# Patient Record
Sex: Female | Born: 1975 | Race: White | Hispanic: No | Marital: Married | Smoking: Never smoker
Health system: Southern US, Community
[De-identification: ages and names within clinical notes are randomized; demographics above are authoritative.]

## PROBLEM LIST (undated history)

## (undated) DIAGNOSIS — B019 Varicella without complication: Secondary | ICD-10-CM

## (undated) DIAGNOSIS — F329 Major depressive disorder, single episode, unspecified: Secondary | ICD-10-CM

## (undated) DIAGNOSIS — F32A Depression, unspecified: Secondary | ICD-10-CM

## (undated) DIAGNOSIS — G43909 Migraine, unspecified, not intractable, without status migrainosus: Secondary | ICD-10-CM

## (undated) HISTORY — DX: Varicella without complication: B01.9

## (undated) HISTORY — DX: Depression, unspecified: F32.A

## (undated) HISTORY — DX: Migraine, unspecified, not intractable, without status migrainosus: G43.909

## (undated) HISTORY — DX: Major depressive disorder, single episode, unspecified: F32.9

---

## 2006-08-28 HISTORY — PX: SALPINGECTOMY: SHX328

## 2007-07-24 ENCOUNTER — Ambulatory Visit (HOSPITAL_COMMUNITY): Admission: RE | Admit: 2007-07-24 | Discharge: 2007-07-24 | Payer: Self-pay | Admitting: Obstetrics and Gynecology

## 2008-09-02 ENCOUNTER — Inpatient Hospital Stay (HOSPITAL_COMMUNITY): Admission: AD | Admit: 2008-09-02 | Discharge: 2008-09-04 | Payer: Self-pay | Admitting: Obstetrics and Gynecology

## 2010-08-31 ENCOUNTER — Inpatient Hospital Stay (HOSPITAL_COMMUNITY)
Admission: AD | Admit: 2010-08-31 | Discharge: 2010-09-02 | Payer: Self-pay | Source: Home / Self Care | Attending: Obstetrics and Gynecology | Admitting: Obstetrics and Gynecology

## 2010-08-31 LAB — CBC
HCT: 37.8 % (ref 36.0–46.0)
Hemoglobin: 13 g/dL (ref 12.0–15.0)
MCH: 30.2 pg (ref 26.0–34.0)
MCHC: 34.4 g/dL (ref 30.0–36.0)
MCV: 87.9 fL (ref 78.0–100.0)
Platelets: 236 10*3/uL (ref 150–400)
RBC: 4.3 MIL/uL (ref 3.87–5.11)
RDW: 13.8 % (ref 11.5–15.5)
WBC: 9.9 10*3/uL (ref 4.0–10.5)

## 2010-08-31 LAB — RPR: RPR Ser Ql: NONREACTIVE

## 2010-09-01 LAB — CBC
HCT: 35 % — ABNORMAL LOW (ref 36.0–46.0)
Hemoglobin: 11.5 g/dL — ABNORMAL LOW (ref 12.0–15.0)
MCH: 29.6 pg (ref 26.0–34.0)
MCHC: 32.9 g/dL (ref 30.0–36.0)
MCV: 90.2 fL (ref 78.0–100.0)
Platelets: 214 10*3/uL (ref 150–400)
RBC: 3.88 MIL/uL (ref 3.87–5.11)
RDW: 14.1 % (ref 11.5–15.5)
WBC: 11.8 10*3/uL — ABNORMAL HIGH (ref 4.0–10.5)

## 2010-12-12 LAB — RPR: RPR Ser Ql: NONREACTIVE

## 2010-12-12 LAB — CBC
HCT: 35.9 % — ABNORMAL LOW (ref 36.0–46.0)
HCT: 41.6 % (ref 36.0–46.0)
Hemoglobin: 12.3 g/dL (ref 12.0–15.0)
Hemoglobin: 14.3 g/dL (ref 12.0–15.0)
MCHC: 34.2 g/dL (ref 30.0–36.0)
MCHC: 34.3 g/dL (ref 30.0–36.0)
MCV: 96.4 fL (ref 78.0–100.0)
MCV: 97.1 fL (ref 78.0–100.0)
Platelets: 197 10*3/uL (ref 150–400)
Platelets: 226 10*3/uL (ref 150–400)
RBC: 3.7 MIL/uL — ABNORMAL LOW (ref 3.87–5.11)
RBC: 4.32 MIL/uL (ref 3.87–5.11)
RDW: 13 % (ref 11.5–15.5)
RDW: 13.1 % (ref 11.5–15.5)
WBC: 11.1 10*3/uL — ABNORMAL HIGH (ref 4.0–10.5)
WBC: 11.5 10*3/uL — ABNORMAL HIGH (ref 4.0–10.5)

## 2013-06-11 ENCOUNTER — Ambulatory Visit (INDEPENDENT_AMBULATORY_CARE_PROVIDER_SITE_OTHER): Payer: BC Managed Care – PPO | Admitting: Emergency Medicine

## 2013-06-11 VITALS — BP 110/70 | HR 66 | Temp 98.0°F | Resp 16 | Ht 66.0 in | Wt 176.0 lb

## 2013-06-11 DIAGNOSIS — J018 Other acute sinusitis: Secondary | ICD-10-CM

## 2013-06-11 DIAGNOSIS — J209 Acute bronchitis, unspecified: Secondary | ICD-10-CM

## 2013-06-11 MED ORDER — AMOXICILLIN-POT CLAVULANATE 875-125 MG PO TABS: 1.0000 | ORAL_TABLET | Freq: Two times a day (BID) | ORAL | Status: DC

## 2013-06-11 MED ORDER — PSEUDOEPHEDRINE-GUAIFENESIN ER 60-600 MG PO TB12: 1.0000 | ORAL_TABLET | Freq: Two times a day (BID) | ORAL | Status: AC

## 2013-06-11 MED ORDER — HYDROCOD POLST-CHLORPHEN POLST 10-8 MG/5ML PO LQCR: 5.0000 mL | Freq: Two times a day (BID) | ORAL | Status: DC | PRN

## 2013-06-11 NOTE — Progress Notes (Signed)
[facility] [address], [place] [address] [phone]  Date:  06/11/2013   Name:  Victoria Tanner   DOB:  Apr 23, 1976   MRN:  [id number]  PCP:  Almon Hercules., MD    Chief Complaint: Cough   History of Present Illness:  Victoria Tanner is a 37 y.o. very pleasant female patient who presents with the following:  Ill with nasal drainage and post nasal drip.  Has a cough productive purulent sputum.  Worse at night.  No wheezing or shortness of breath.  No fever or chills.  Sore throat.  Eating and drinking, no nausea or vomiting.  No stool change or rash.  No improvement with over the counter medications or other home remedies. Denies other complaint or health concern today.   There are no active problems to display for this patient.   History reviewed. No pertinent past medical history.  History reviewed. No pertinent past surgical history.  History  Substance Use Topics  . Smoking status: Never Smoker   . Smokeless tobacco: Not on file  . Alcohol Use: Not on file    History reviewed. No pertinent family history.  No Known Allergies  Medication list has been reviewed and updated.  No current outpatient prescriptions on file prior to visit.   No current facility-administered medications on file prior to visit.    Review of Systems:  As per HPI, otherwise negative.    Physical Examination: Filed Vitals:   06/11/13 1103  BP: 110/70  Pulse: 66  Temp: 98 F (36.7 C)  Resp: 16   Filed Vitals:   06/11/13 1103  Height: 5\' 6"  (1.676 m)  Weight: 176 lb (79.833 kg)   Body mass index is 28.42 kg/(m^2). Ideal Body Weight: Weight in (lb) to have BMI = 25: 154.6  GEN: WDWN, NAD, Non-toxic, A & O x 3 HEENT: Atraumatic, Normocephalic. Neck supple. No masses, No LAD. Ears and Nose: No external deformity. CV: RRR, No M/G/R. No JVD. No thrill. No extra heart sounds. PULM: CTA B, no wheezes, crackles, rhonchi. No retractions. No resp. distress. No  accessory muscle use. ABD: S, NT, ND, +BS. No rebound. No HSM. EXTR: No c/c/e NEURO Normal gait.  PSYCH: Normally interactive. Conversant. Not depressed or anxious appearing.  Calm demeanor.    Assessment and Plan: Sinusitis Bronchitis mucinex c tussionex augmentin   Signed,  Phillips Odor, MD

## 2013-06-11 NOTE — Patient Instructions (Signed)

## 2014-10-06 ENCOUNTER — Ambulatory Visit (INDEPENDENT_AMBULATORY_CARE_PROVIDER_SITE_OTHER): Payer: BLUE CROSS/BLUE SHIELD

## 2014-10-06 ENCOUNTER — Ambulatory Visit (INDEPENDENT_AMBULATORY_CARE_PROVIDER_SITE_OTHER): Payer: BLUE CROSS/BLUE SHIELD | Admitting: Emergency Medicine

## 2014-10-06 VITALS — BP 110/68 | HR 80 | Temp 98.2°F | Resp 16 | Ht 64.0 in | Wt 185.0 lb

## 2014-10-06 DIAGNOSIS — J0140 Acute pansinusitis, unspecified: Secondary | ICD-10-CM

## 2014-10-06 DIAGNOSIS — R059 Cough, unspecified: Secondary | ICD-10-CM

## 2014-10-06 DIAGNOSIS — R05 Cough: Secondary | ICD-10-CM

## 2014-10-06 DIAGNOSIS — J209 Acute bronchitis, unspecified: Secondary | ICD-10-CM

## 2014-10-06 MED ORDER — HYDROCOD POLST-CHLORPHEN POLST 10-8 MG/5ML PO LQCR: 5.0000 mL | Freq: Two times a day (BID) | ORAL | Status: DC | PRN

## 2014-10-06 MED ORDER — PSEUDOEPHEDRINE-GUAIFENESIN ER 60-600 MG PO TB12: 1.0000 | ORAL_TABLET | Freq: Two times a day (BID) | ORAL | Status: DC

## 2014-10-06 MED ORDER — AMOXICILLIN-POT CLAVULANATE 875-125 MG PO TABS: 1.0000 | ORAL_TABLET | Freq: Two times a day (BID) | ORAL | Status: DC

## 2014-10-06 NOTE — Patient Instructions (Signed)

## 2014-10-06 NOTE — Progress Notes (Signed)
[facility] [address], [place] [id number] [phone]  Date:  10/06/2014   Name:  Victoria HuskyMelisa S Tanner   DOB:  10-06-1975   MRN:  [id number]  PCP:  Almon HerculesOSS,KENDRA H., MD    Chief Complaint: Cough   History of Present Illness:  Victoria Tanner is a 39 y.o. very pleasant female patient who presents with the following:  2 week history of cough productive of green sputum and post tussive wheezing. Some purulent post nasal drainage. No sore throat No fever or chills No nausea or vomiting.  No stool change No improvement with over the counter medications or other home remedies.  Denies other complaint or health concern today.   There are no active problems to display for this patient.   No past medical history on file.  No past surgical history on file.  History  Substance Use Topics  . Smoking status: Never Smoker   . Smokeless tobacco: Not on file  . Alcohol Use: Not on file    No family history on file.  No Known Allergies  Medication list has been reviewed and updated.  No current outpatient prescriptions on file prior to visit.   No current facility-administered medications on file prior to visit.    Review of Systems:  As per HPI, otherwise negative.    Physical Examination: Filed Vitals:   10/06/14 1051  BP: 110/68  Pulse: 80  Temp: 98.2 F (36.8 C)  Resp: 16   Filed Vitals:   10/06/14 1051  Height: 5\' 4"  (1.626 m)  Weight: 185 lb (83.915 kg)   Body mass index is 31.74 kg/(m^2). Ideal Body Weight: Weight in (lb) to have BMI = 25: 145.3  GEN: WDWN, NAD, Non-toxic, A & O x 3 HEENT: Atraumatic, Normocephalic. Neck supple. No masses, No LAD. Ears and Nose: No external deformity. CV: RRR, No M/G/R. No JVD. No thrill. No extra heart sounds. PULM: CTA B, scattered wheezes, no crackles, rhonchi. No retractions. No resp. distress. No accessory muscle use. ABD: S, NT, ND, +BS. No rebound. No HSM. EXTR: No c/c/e NEURO Normal gait.   PSYCH: Normally interactive. Conversant. Not depressed or anxious appearing.  Calm demeanor.    Assessment and Plan: Sinusitis Bronchitis augmentin mucinex d tussionex   Signed,  Phillips OdorJeffery Anderson, MD   [facility] reading (PRIMARY) by  Dr. Dareen PianoAnderson.  Negative chest.

## 2015-03-17 ENCOUNTER — Ambulatory Visit (INDEPENDENT_AMBULATORY_CARE_PROVIDER_SITE_OTHER): Payer: BLUE CROSS/BLUE SHIELD | Admitting: Emergency Medicine

## 2015-03-17 DIAGNOSIS — T63441A Toxic effect of venom of bees, accidental (unintentional), initial encounter: Secondary | ICD-10-CM

## 2015-03-17 MED ORDER — METHYLPREDNISOLONE ACETATE 80 MG/ML IJ SUSP
120.0000 mg | Freq: Once | INTRAMUSCULAR | Status: AC
  Administered 2015-03-17: 120 mg via INTRAMUSCULAR

## 2015-03-17 NOTE — Progress Notes (Signed)
Subjective:  Patient ID: Victoria Tanner, female    DOB: 1976-08-24  Age: 39 y.o. MRN: [id number]  CC: Insect Bite   HPI Victoria Tanner presents  with numerous insect bites. She was assembling a swimming pool in the backyard and he inadvertently stepped on a nest of some stinging insects that she thinks was Journalist, newspaperyellowjacket. She was stung repeatedly and lower extremities. This occurred yesterday. She has no difficulty swallowing or breathing. She has no trouble speaking. She has no systemic rash.  History Victoria Tanner has no past medical history on file.   She has no past surgical history on file.   Her  family history is not on file.  She   reports that she has never smoked. She does not have any smokeless tobacco history on file. Her alcohol and drug histories are not on file.  Outpatient Prescriptions Prior to Visit  Medication Sig Dispense Refill  . amoxicillin-clavulanate (AUGMENTIN) 875-125 MG per tablet Take 1 tablet by mouth 2 (two) times daily. (Patient not taking: Reported on 03/17/2015) 20 tablet 0  . chlorpheniramine-HYDROcodone (TUSSIONEX PENNKINETIC ER) 10-8 MG/5ML LQCR Take 5 mLs by mouth every 12 (twelve) hours as needed. (Patient not taking: Reported on 03/17/2015) 60 mL 0  . pseudoephedrine-guaifenesin (MUCINEX D) 60-600 MG per tablet Take 1 tablet by mouth every 12 (twelve) hours. (Patient not taking: Reported on 03/17/2015) 18 tablet 0   No facility-administered medications prior to visit.    History   Social History  . Marital Status: Married    Spouse Name: N/A  . Number of Children: N/A  . Years of Education: N/A   Social History Main Topics  . Smoking status: Never Smoker   . Smokeless tobacco: Not on file  . Alcohol Use: Not on file  . Drug Use: Not on file  . Sexual Activity: Not on file   Other Topics Concern  . Not on file   Social History Narrative     Review of Systems  Constitutional: Negative for fever, chills and appetite change.  HENT:  Negative for congestion, ear pain, postnasal drip, sinus pressure and sore throat.   Eyes: Negative for pain and redness.  Respiratory: Negative for cough, shortness of breath and wheezing.   Cardiovascular: Negative for leg swelling.  Gastrointestinal: Negative for nausea, vomiting, abdominal pain, diarrhea, constipation and blood in stool.  Endocrine: Negative for polyuria.  Genitourinary: Negative for dysuria, urgency, frequency and flank pain.  Musculoskeletal: Negative for gait problem.  Skin: Negative for rash.  Neurological: Negative for weakness and headaches.  Psychiatric/Behavioral: Negative for confusion and decreased concentration. The patient is not nervous/anxious.     Objective:  There were no vitals taken for this visit.  Physical Exam  Constitutional: She is oriented to person, place, and time. She appears well-developed and well-nourished.  HENT:  Head: Normocephalic and atraumatic.  Eyes: Conjunctivae are normal. Pupils are equal, round, and reactive to light.  Pulmonary/Chest: Effort normal.  Musculoskeletal: She exhibits no edema.  Neurological: She is alert and oriented to person, place, and time.  Skin: Skin is dry. There is erythema.  Psychiatric: She has a normal mood and affect. Her behavior is normal. Thought content normal.      Assessment & Plan:   Victoria Tanner was seen today for insect bite.  Diagnoses and all orders for this visit:  Bee sting, accidental or unintentional, initial encounter Orders: -     methylPREDNISolone acetate (DEPO-MEDROL) injection 120 mg; Inject 1.5 mLs (120 mg total)  into the muscle once.   I am having Victoria Tanner maintain her amoxicillin-clavulanate, pseudoephedrine-guaifenesin, chlorpheniramine-HYDROcodone, and diphenhydrAMINE. We administered methylPREDNISolone acetate.  Meds ordered this encounter  Medications  . diphenhydrAMINE (BENADRYL) 25 MG tablet    Sig: Take 25 mg by mouth every 6 (six) hours as needed.  .  methylPREDNISolone acetate (DEPO-MEDROL) injection 120 mg    Sig:     Appropriate red flag conditions were discussed with the patient as well as actions that should be taken.  Patient expressed his understanding.  Follow-up: Return if symptoms worsen or fail to improve.  Carmelina Dane, MD

## 2015-03-17 NOTE — Patient Instructions (Signed)

## 2015-06-21 ENCOUNTER — Encounter: Payer: Self-pay | Admitting: Adult Health

## 2015-06-21 ENCOUNTER — Ambulatory Visit (INDEPENDENT_AMBULATORY_CARE_PROVIDER_SITE_OTHER): Payer: BLUE CROSS/BLUE SHIELD | Admitting: Adult Health

## 2015-06-21 VITALS — BP 110/70 | Temp 98.2°F | Ht 65.0 in | Wt 190.1 lb

## 2015-06-21 DIAGNOSIS — Z0000 Encounter for general adult medical examination without abnormal findings: Secondary | ICD-10-CM

## 2015-06-21 NOTE — Patient Instructions (Addendum)
It was great meeting you today!   Make an appointment at the front for your lab draw.   You can follow up with me in one year for your next physical exam or sooner if needed.   Continue to eat healthy and exercise.   If you need anything or have any questions, please let me know.   Health Maintenance, Female Adopting a healthy lifestyle and getting preventive care can go a long way to promote health and wellness. Talk with your health care provider about what schedule of regular examinations is right for you. This is a good chance for you to check in with your provider about disease prevention and staying healthy. In between checkups, there are plenty of things you can do on your own. Experts have done a lot of research about which lifestyle changes and preventive measures are most likely to keep you healthy. Ask your health care provider for more information. WEIGHT AND DIET  Eat a healthy diet  Be sure to include plenty of vegetables, fruits, low-fat dairy products, and lean protein.  Do not eat a lot of foods high in solid fats, added sugars, or salt.  Get regular exercise. This is one of the most important things you can do for your health.  Most adults should exercise for at least 150 minutes each week. The exercise should increase your heart rate and make you sweat (moderate-intensity exercise).  Most adults should also do strengthening exercises at least twice a week. This is in addition to the moderate-intensity exercise.  Maintain a healthy weight  Body mass index (BMI) is a measurement that can be used to identify possible weight problems. It estimates body fat based on height and weight. Your health care provider can help determine your BMI and help you achieve or maintain a healthy weight.  For females 24 years of age and older:   A BMI below 18.5 is considered underweight.  A BMI of 18.5 to 24.9 is normal.  A BMI of 25 to 29.9 is considered overweight.  A BMI of 30  and above is considered obese.  Watch levels of cholesterol and blood lipids  You should start having your blood tested for lipids and cholesterol at 40 years of age, then have this test every 5 years.  You may need to have your cholesterol levels checked more often if:  Your lipid or cholesterol levels are high.  You are older than 40 years of age.  You are at high risk for heart disease.  CANCER SCREENING   Lung Cancer  Lung cancer screening is recommended for adults 40-24 years old who are at high risk for lung cancer because of a history of smoking.  A yearly low-dose CT scan of the lungs is recommended for people who:  Currently smoke.  Have quit within the past 15 years.  Have at least a 30-pack-year history of smoking. A pack year is smoking an average of one pack of cigarettes a day for 1 year.  Yearly screening should continue until it has been 15 years since you quit.  Yearly screening should stop if you develop a health problem that would prevent you from having lung cancer treatment.  Breast Cancer  Practice breast self-awareness. This means understanding how your breasts normally appear and feel.  It also means doing regular breast self-exams. Let your health care provider know about any changes, no matter how small.  If you are in your 20s or 30s, you should have a  clinical breast exam (CBE) by a health care provider every 1-3 years as part of a regular health exam.  If you are 40 or older, have a CBE every year. Also consider having a breast X-ray (mammogram) every year.  If you have a family history of breast cancer, talk to your health care provider about genetic screening.  If you are at high risk for breast cancer, talk to your health care provider about having an MRI and a mammogram every year.  Breast cancer gene (BRCA) assessment is recommended for women who have family members with BRCA-related cancers. BRCA-related cancers  include:  Breast.  Ovarian.  Tubal.  Peritoneal cancers.  Results of the assessment will determine the need for genetic counseling and BRCA1 and BRCA2 testing. Cervical Cancer Your health care provider may recommend that you be screened regularly for cancer of the pelvic organs (ovaries, uterus, and vagina). This screening involves a pelvic examination, including checking for microscopic changes to the surface of your cervix (Pap test). You may be encouraged to have this screening done every 3 years, beginning at age 21.  For women ages 30-65, health care providers may recommend pelvic exams and Pap testing every 3 years, or they may recommend the Pap and pelvic exam, combined with testing for human papilloma virus (HPV), every 5 years. Some types of HPV increase your risk of cervical cancer. Testing for HPV may also be done on women of any age with unclear Pap test results.  Other health care providers may not recommend any screening for nonpregnant women who are considered low risk for pelvic cancer and who do not have symptoms. Ask your health care provider if a screening pelvic exam is right for you.  If you have had past treatment for cervical cancer or a condition that could lead to cancer, you need Pap tests and screening for cancer for at least 20 years after your treatment. If Pap tests have been discontinued, your risk factors (such as having a new sexual partner) need to be reassessed to determine if screening should resume. Some women have medical problems that increase the chance of getting cervical cancer. In these cases, your health care provider may recommend more frequent screening and Pap tests. Colorectal Cancer  This type of cancer can be detected and often prevented.  Routine colorectal cancer screening usually begins at 40 years of age and continues through 40 years of age.  Your health care provider may recommend screening at an earlier age if you have risk factors for  colon cancer.  Your health care provider may also recommend using home test kits to check for hidden blood in the stool.  A small camera at the end of a tube can be used to examine your colon directly (sigmoidoscopy or colonoscopy). This is done to check for the earliest forms of colorectal cancer.  Routine screening usually begins at age 50.  Direct examination of the colon should be repeated every 5-10 years through 40 years of age. However, you may need to be screened more often if early forms of precancerous polyps or small growths are found. Skin Cancer  Check your skin from head to toe regularly.  Tell your health care provider about any new moles or changes in moles, especially if there is a change in a mole's shape or color.  Also tell your health care provider if you have a mole that is larger than the size of a pencil eraser.  Always use sunscreen. Apply sunscreen liberally   and repeatedly throughout the day.  Protect yourself by wearing long sleeves, pants, a wide-brimmed hat, and sunglasses whenever you are outside. HEART DISEASE, DIABETES, AND HIGH BLOOD PRESSURE   High blood pressure causes heart disease and increases the risk of stroke. High blood pressure is more likely to develop in:  People who have blood pressure in the high end of the normal range (130-139/85-89 mm Hg).  People who are overweight or obese.  People who are African American.  If you are 68-18 years of age, have your blood pressure checked every 3-5 years. If you are 96 years of age or older, have your blood pressure checked every year. You should have your blood pressure measured twice--once when you are at a hospital or clinic, and once when you are not at a hospital or clinic. Record the average of the two measurements. To check your blood pressure when you are not at a hospital or clinic, you can use:  An automated blood pressure machine at a pharmacy.  A home blood pressure monitor.  If you  are between 52 years and 37 years old, ask your health care provider if you should take aspirin to prevent strokes.  Have regular diabetes screenings. This involves taking a blood sample to check your fasting blood sugar level.  If you are at a normal weight and have a low risk for diabetes, have this test once every three years after 40 years of age.  If you are overweight and have a high risk for diabetes, consider being tested at a younger age or more often. PREVENTING INFECTION  Hepatitis B  If you have a higher risk for hepatitis B, you should be screened for this virus. You are considered at high risk for hepatitis B if:  You were born in a country where hepatitis B is common. Ask your health care provider which countries are considered high risk.  Your parents were born in a high-risk country, and you have not been immunized against hepatitis B (hepatitis B vaccine).  You have HIV or AIDS.  You use needles to inject street drugs.  You live with someone who has hepatitis B.  You have had sex with someone who has hepatitis B.  You get hemodialysis treatment.  You take certain medicines for conditions, including cancer, organ transplantation, and autoimmune conditions. Hepatitis C  Blood testing is recommended for:  Everyone born from [birth year] through 1966.  Anyone with known risk factors for hepatitis C. Sexually transmitted infections (STIs)  You should be screened for sexually transmitted infections (STIs) including gonorrhea and chlamydia if:  You are sexually active and are younger than 40 years of age.  You are older than 40 years of age and your health care provider tells you that you are at risk for this type of infection.  Your sexual activity has changed since you were last screened and you are at an increased risk for chlamydia or gonorrhea. Ask your health care provider if you are at risk.  If you do not have HIV, but are at risk, it may be recommended that you  take a prescription medicine daily to prevent HIV infection. This is called pre-exposure prophylaxis (PrEP). You are considered at risk if:  You are sexually active and do not regularly use condoms or know the HIV status of your partner(s).  You take drugs by injection.  You are sexually active with a partner who has HIV. Talk with your health care provider about whether you are  at high risk of being infected with HIV. If you choose to begin PrEP, you should first be tested for HIV. You should then be tested every 3 months for as long as you are taking PrEP.  PREGNANCY   If you are premenopausal and you may become pregnant, ask your health care provider about preconception counseling.  If you may become pregnant, take 400 to 800 micrograms (mcg) of folic acid every day.  If you want to prevent pregnancy, talk to your health care provider about birth control (contraception). OSTEOPOROSIS AND MENOPAUSE   Osteoporosis is a disease in which the bones lose minerals and strength with aging. This can result in serious bone fractures. Your risk for osteoporosis can be identified using a bone density scan.  If you are 56 years of age or older, or if you are at risk for osteoporosis and fractures, ask your health care provider if you should be screened.  Ask your health care provider whether you should take a calcium or vitamin D supplement to lower your risk for osteoporosis.  Menopause may have certain physical symptoms and risks.  Hormone replacement therapy may reduce some of these symptoms and risks. Talk to your health care provider about whether hormone replacement therapy is right for you.  HOME CARE INSTRUCTIONS   Schedule regular health, dental, and eye exams.  Stay current with your immunizations.   Do not use any tobacco products including cigarettes, chewing tobacco, or electronic cigarettes.  If you are pregnant, do not drink alcohol.  If you are breastfeeding, limit how  much and how often you drink alcohol.  Limit alcohol intake to no more than 1 drink per day for nonpregnant women. One drink equals 12 ounces of beer, 5 ounces of wine, or 1 ounces of hard liquor.  Do not use street drugs.  Do not share needles.  Ask your health care provider for help if you need support or information about quitting drugs.  Tell your health care provider if you often feel depressed.  Tell your health care provider if you have ever been abused or do not feel safe at home.   This information is not intended to replace advice given to you by your health care provider. Make sure you discuss any questions you have with your health care provider.   Document Released: 02/27/2011 Document Revised: 09/04/2014 Document Reviewed: 07/16/2013 Elsevier Interactive Patient Education [employer].

## 2015-06-21 NOTE — Progress Notes (Signed)
Pre visit review using our clinic review tool, if applicable. No additional management support is needed unless otherwise documented below in the visit note. 

## 2015-06-21 NOTE — Progress Notes (Signed)
HPI:  Victoria Tanner is here to establish care and for her complete physical.  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 Does not eat a lot of processed foods.  Exercise: Does high intensity interval training 3-4 times a week. Walks the dog  Pap Smear:August 2016 Dentist: Twice a year Eye exam: Yearly  Is followed by: GYN - [facility] Tenny Crawoss [facility]   All immunizations and health maintenance protocols were reviewed with the patient and needed orders were placed.  Appropriate screening laboratory values were ordered for the patient including screening of hyperlipidemia, renal function and hepatic function.  Medication reconciliation,  past medical history, social history, problem list and allergies were reviewed in detail with the patient  Goals were established with regard to weight loss, exercise, and  diet in compliance with medications   Has the following chronic problems that require follow up and concerns today:   Depression: Is well controlled. She was on antidepressants last year but has since taken herself off. Has no complaints of depression.    ROS negative for unless reported above: fevers, chills,feeling poorly, unintentional weight loss, hearing or vision loss, chest pain, palpitations, leg claudication, struggling to breath,Not feeling congested in the chest, no orthopenia, no cough,no wheezing, normal appetite, no soft tissue swelling, no hemoptysis, melena, hematochezia, hematuria, falls, loc, si, or thoughts of self harm.    Past Medical History  Diagnosis Date  . Depression   . Migraines   . Chicken pox     History reviewed. No pertinent past surgical history.  Family History  Problem Relation Age of Onset  . Arthritis Maternal Grandmother   . Elevated Lipids Paternal Grandfather   . Elevated Lipids Maternal Grandmother   . Elevated Lipids Father   .  Hypertension Mother   . Hypertension Maternal Grandmother   . Stroke Maternal Grandmother   . Stroke Paternal Grandmother   . Depression Mother   . OCD Mother   . ADD / ADHD Mother   . Depression Maternal Grandmother   . OCD Maternal Grandmother   . ADD / ADHD Maternal Grandmother   . Diabetes Paternal Grandfather     Social History   Social History  . Marital Status: Married    Spouse Name: N/A  . Number of Children: N/A  . Years of Education: N/A   Social History Main Topics  . Smoking status: Never Smoker   . Smokeless tobacco: None  . Alcohol Use: No  . Drug Use: No  . Sexual Activity: Not Asked   Other Topics Concern  . None   Social History Narrative    No current outpatient prescriptions on file.  EXAM:  Filed Vitals:   06/21/15 1006  BP: 110/70  Temp: 98.2 F (36.8 C)    Body mass index is 31.63 kg/(m^2).  GENERAL: vitals reviewed and listed above, alert, oriented, appears well hydrated and in no acute distress. Slightly obese  HEENT: atraumatic, conjunttiva clear, no obvious abnormalities on inspection of external nose and ears. TM's visualized. No cerumen impaction.  NECK: Neck is soft and supple without masses, no adenopathy or thyromegaly, trachea midline, no JVD. Normal range of motion.   LUNGS: clear to auscultation bilaterally, no wheezes, rales or rhonchi, good air movement  CV: Regular rate and rhythm, normal S1/S2, no audible murmurs, gallops, or rubs. No carotid bruit and no peripheral edema.   MS:  moves all extremities without noticeable abnormality. No edema noted  Abd: soft/nontender/nondistended/normal bowel sounds   Skin: warm and dry, no rash   Extremities: No clubbing, cyanosis, or edema. Capillary refill is WNL. Pulses intact bilaterally in upper and lower extremities.   Neuro: CN II-XII intact, sensation and reflexes normal throughout, 5/5 muscle strength in bilateral upper and lower extremities. Normal finger to nose.  Normal rapid alternating movements.   PSYCH: pleasant and cooperative, no obvious depression or anxiety  ASSESSMENT AND PLAN:  1. Routine general medical examination at a health care facility - Basic metabolic panel; Future - CBC with Differential/Platelet; Future - Hemoglobin A1c; Future - Hepatic function panel; Future - Lipid panel; Future - TSH; Future - POCT urinalysis dipstick; Future - Continue to eat healthy and exercise.  - Follow up in one year for CPE - Follow up sooner if needed.   -We reviewed the PMH, PSH, FH, SH, Meds and Allergies. -We provided refills for any medications we will prescribe as needed. -We addressed current concerns per orders and patient instructions. -We have asked for records for pertinent exams, studies, vaccines and notes from previous providers. -We have advised patient to follow up per instructions below.   -Patient advised to return or notify a provider immediately if symptoms worsen or persist or new concerns arise.    Shirline Frees, AGNP

## 2015-06-30 ENCOUNTER — Other Ambulatory Visit (INDEPENDENT_AMBULATORY_CARE_PROVIDER_SITE_OTHER): Payer: BLUE CROSS/BLUE SHIELD

## 2015-06-30 DIAGNOSIS — Z0000 Encounter for general adult medical examination without abnormal findings: Secondary | ICD-10-CM | POA: Diagnosis not present

## 2015-06-30 LAB — POCT URINALYSIS DIPSTICK
Bilirubin, UA: NEGATIVE
Glucose, UA: NEGATIVE
Ketones, UA: NEGATIVE
Leukocytes, UA: NEGATIVE
Nitrite, UA: NEGATIVE
Protein, UA: NEGATIVE
Spec Grav, UA: 1.01
Urobilinogen, UA: 0.2
pH, UA: 6

## 2015-06-30 LAB — CBC WITH DIFFERENTIAL/PLATELET
Basophils Absolute: 0.1 10*3/uL (ref 0.0–0.1)
Basophils Relative: 0.9 % (ref 0.0–3.0)
Eosinophils Absolute: 0.2 10*3/uL (ref 0.0–0.7)
Eosinophils Relative: 2 % (ref 0.0–5.0)
HCT: 41.6 % (ref 36.0–46.0)
Hemoglobin: 13.6 g/dL (ref 12.0–15.0)
Lymphocytes Relative: 23.7 % (ref 12.0–46.0)
Lymphs Abs: 1.8 10*3/uL (ref 0.7–4.0)
MCHC: 32.7 g/dL (ref 30.0–36.0)
MCV: 89.2 fl (ref 78.0–100.0)
Monocytes Absolute: 0.5 10*3/uL (ref 0.1–1.0)
Monocytes Relative: 6.6 % (ref 3.0–12.0)
Neutro Abs: 5.2 10*3/uL (ref 1.4–7.7)
Neutrophils Relative %: 66.8 % (ref 43.0–77.0)
Platelets: 335 10*3/uL (ref 150.0–400.0)
RBC: 4.67 Mil/uL (ref 3.87–5.11)
RDW: 14.2 % (ref 11.5–15.5)
WBC: 7.8 10*3/uL (ref 4.0–10.5)

## 2015-06-30 LAB — HEPATIC FUNCTION PANEL
ALT: 14 U/L (ref 0–35)
AST: 16 U/L (ref 0–37)
Albumin: 4.1 g/dL (ref 3.5–5.2)
Alkaline Phosphatase: 76 U/L (ref 39–117)
Bilirubin, Direct: 0.1 mg/dL (ref 0.0–0.3)
Total Bilirubin: 0.5 mg/dL (ref 0.2–1.2)
Total Protein: 6.6 g/dL (ref 6.0–8.3)

## 2015-06-30 LAB — BASIC METABOLIC PANEL
BUN: 13 mg/dL (ref 6–23)
CO2: 30 mEq/L (ref 19–32)
Calcium: 9.6 mg/dL (ref 8.4–10.5)
Chloride: 105 mEq/L (ref 96–112)
Creatinine, Ser: 0.77 mg/dL (ref 0.40–1.20)
GFR: 88.07 mL/min (ref >60.00)
Glucose, Bld: 93 mg/dL (ref 70–99)
Potassium: 4.6 mEq/L (ref 3.5–5.1)
Sodium: 141 mEq/L (ref 135–145)

## 2015-06-30 LAB — LIPID PANEL
Cholesterol: 201 mg/dL — ABNORMAL HIGH (ref 0–200)
HDL: 50.1 mg/dL (ref >39.00)
LDL Cholesterol: 125 mg/dL — ABNORMAL HIGH (ref 0–99)
NonHDL: 150.76
Total CHOL/HDL Ratio: 4
Triglycerides: 128 mg/dL (ref 0.0–149.0)
VLDL: 25.6 mg/dL (ref 0.0–40.0)

## 2015-06-30 LAB — HEMOGLOBIN A1C: Hgb A1c MFr Bld: 5 % (ref 4.6–6.5)

## 2015-06-30 LAB — TSH: TSH: 1.53 u[IU]/mL (ref 0.35–4.50)

## 2016-03-11 DIAGNOSIS — H5213 Myopia, bilateral: Secondary | ICD-10-CM | POA: Diagnosis not present

## 2016-03-22 DIAGNOSIS — M25552 Pain in left hip: Secondary | ICD-10-CM | POA: Diagnosis not present

## 2016-03-22 DIAGNOSIS — M791 Myalgia: Secondary | ICD-10-CM | POA: Diagnosis not present

## 2016-03-22 DIAGNOSIS — M461 Sacroiliitis, not elsewhere classified: Secondary | ICD-10-CM | POA: Diagnosis not present

## 2016-03-22 DIAGNOSIS — M50321 Other cervical disc degeneration at C4-C5 level: Secondary | ICD-10-CM | POA: Diagnosis not present

## 2016-03-22 DIAGNOSIS — M50221 Other cervical disc displacement at C4-C5 level: Secondary | ICD-10-CM | POA: Diagnosis not present

## 2016-03-22 DIAGNOSIS — M545 Low back pain: Secondary | ICD-10-CM | POA: Diagnosis not present

## 2016-03-22 DIAGNOSIS — M25561 Pain in right knee: Secondary | ICD-10-CM | POA: Diagnosis not present

## 2016-03-22 DIAGNOSIS — M25551 Pain in right hip: Secondary | ICD-10-CM | POA: Diagnosis not present

## 2016-03-22 DIAGNOSIS — M542 Cervicalgia: Secondary | ICD-10-CM | POA: Diagnosis not present

## 2016-03-24 DIAGNOSIS — M791 Myalgia: Secondary | ICD-10-CM | POA: Diagnosis not present

## 2016-03-24 DIAGNOSIS — M542 Cervicalgia: Secondary | ICD-10-CM | POA: Diagnosis not present

## 2016-03-24 DIAGNOSIS — M461 Sacroiliitis, not elsewhere classified: Secondary | ICD-10-CM | POA: Diagnosis not present

## 2016-03-24 DIAGNOSIS — M50221 Other cervical disc displacement at C4-C5 level: Secondary | ICD-10-CM | POA: Diagnosis not present

## 2016-03-24 DIAGNOSIS — M50321 Other cervical disc degeneration at C4-C5 level: Secondary | ICD-10-CM | POA: Diagnosis not present

## 2016-03-24 DIAGNOSIS — M545 Low back pain: Secondary | ICD-10-CM | POA: Diagnosis not present

## 2016-04-03 DIAGNOSIS — M50321 Other cervical disc degeneration at C4-C5 level: Secondary | ICD-10-CM | POA: Diagnosis not present

## 2016-04-03 DIAGNOSIS — M47816 Spondylosis without myelopathy or radiculopathy, lumbar region: Secondary | ICD-10-CM | POA: Diagnosis not present

## 2016-04-03 DIAGNOSIS — M542 Cervicalgia: Secondary | ICD-10-CM | POA: Diagnosis not present

## 2016-04-03 DIAGNOSIS — M461 Sacroiliitis, not elsewhere classified: Secondary | ICD-10-CM | POA: Diagnosis not present

## 2016-04-03 DIAGNOSIS — M50221 Other cervical disc displacement at C4-C5 level: Secondary | ICD-10-CM | POA: Diagnosis not present

## 2016-04-03 DIAGNOSIS — M791 Myalgia: Secondary | ICD-10-CM | POA: Diagnosis not present

## 2016-04-03 DIAGNOSIS — M5127 Other intervertebral disc displacement, lumbosacral region: Secondary | ICD-10-CM | POA: Diagnosis not present

## 2016-06-13 DIAGNOSIS — Z1231 Encounter for screening mammogram for malignant neoplasm of breast: Secondary | ICD-10-CM | POA: Diagnosis not present

## 2016-06-13 DIAGNOSIS — Z124 Encounter for screening for malignant neoplasm of cervix: Secondary | ICD-10-CM | POA: Diagnosis not present

## 2016-06-13 DIAGNOSIS — Z01419 Encounter for gynecological examination (general) (routine) without abnormal findings: Secondary | ICD-10-CM | POA: Diagnosis not present

## 2016-06-13 DIAGNOSIS — Z6831 Body mass index (BMI) 31.0-31.9, adult: Secondary | ICD-10-CM | POA: Diagnosis not present

## 2016-06-13 LAB — HM MAMMOGRAPHY

## 2016-07-25 ENCOUNTER — Ambulatory Visit (INDEPENDENT_AMBULATORY_CARE_PROVIDER_SITE_OTHER): Payer: BLUE CROSS/BLUE SHIELD | Admitting: Adult Health

## 2016-07-25 ENCOUNTER — Encounter: Payer: Self-pay | Admitting: Adult Health

## 2016-07-25 VITALS — BP 126/64 | Temp 98.2°F | Ht 65.0 in | Wt 191.4 lb

## 2016-07-25 DIAGNOSIS — R1013 Epigastric pain: Secondary | ICD-10-CM

## 2016-07-25 LAB — COMPREHENSIVE METABOLIC PANEL
ALT: 16 U/L (ref 0–35)
AST: 19 U/L (ref 0–37)
Albumin: 4.3 g/dL (ref 3.5–5.2)
Alkaline Phosphatase: 84 U/L (ref 39–117)
BUN: 13 mg/dL (ref 6–23)
CO2: 28 mEq/L (ref 19–32)
Calcium: 9.8 mg/dL (ref 8.4–10.5)
Chloride: 102 mEq/L (ref 96–112)
Creatinine, Ser: 0.74 mg/dL (ref 0.40–1.20)
GFR: 91.72 mL/min (ref >60.00)
Glucose, Bld: 82 mg/dL (ref 70–99)
Potassium: 4.1 mEq/L (ref 3.5–5.1)
Sodium: 139 mEq/L (ref 135–145)
Total Bilirubin: 0.6 mg/dL (ref 0.2–1.2)
Total Protein: 6.6 g/dL (ref 6.0–8.3)

## 2016-07-25 LAB — URINALYSIS, ROUTINE W REFLEX MICROSCOPIC
Bilirubin Urine: NEGATIVE
Hgb urine dipstick: NEGATIVE
Ketones, ur: NEGATIVE
Leukocytes, UA: NEGATIVE
Nitrite: NEGATIVE
RBC / HPF: NONE SEEN (ref 0–?)
Specific Gravity, Urine: 1.01 (ref 1.000–1.030)
Total Protein, Urine: NEGATIVE
Urine Glucose: NEGATIVE
Urobilinogen, UA: 0.2 (ref 0.0–1.0)
pH: 7.5 (ref 5.0–8.0)

## 2016-07-25 LAB — LIPASE: Lipase: 21 U/L (ref 11.0–59.0)

## 2016-07-25 LAB — H. PYLORI ANTIBODY, IGG: H Pylori IgG: NEGATIVE

## 2016-07-25 MED ORDER — PANTOPRAZOLE SODIUM 40 MG PO TBEC: 40.0000 mg | DELAYED_RELEASE_TABLET | Freq: Every day | ORAL | 2 refills | Status: DC

## 2016-07-25 NOTE — Patient Instructions (Addendum)
It was great seeing you today.   As discussed your exam was consistent with a possible stomach ulcer.   I have sent in a prescription for Protonix, take this instead of Zantic. For the next week, take half a tab of Zantic with the protonix.   I will follow up with you reagarding your blood work.  Please let me know if you are not feeling any better in the next 1-2 weeks   Peptic Ulcer A peptic ulcer is a painful sore in the lining of your esophagus, stomach, or in the first part of your small intestine. The main causes of an ulcer can be:  An infection.  Using certain pain medicines too often or too much.  Smoking. HOME CARE  Avoid smoking, alcohol, and caffeine.  Avoid foods that bother you.  Only take medicine as told by your doctor. Do not take any medicines your doctor has not approved.  Keep all doctor visits as told. GET HELP IF:  You do not get better in 7 days after starting treatment.  You keep having an upset stomach (indigestion) or heartburn. GET HELP RIGHT AWAY IF:  You have sudden, sharp, or lasting belly (abdominal) pain.  You have bloody, black, or tarry poop (stool).  You throw up (vomit) blood or your throw up looks like coffee grounds.  You get light-headed, weak, or feel like you will pass out (faint).  You get sweaty or feel sticky and cold to the touch (clammy). MAKE SURE YOU:   Understand these instructions.  Will watch your condition.  Will get help right away if you are not doing well or get worse. This information is not intended to replace advice given to you by your health care provider. Make sure you discuss any questions you have with your health care provider. Document Released: 11/08/2009 Document Revised: 09/04/2014 Document Reviewed: 05/15/2015 Elsevier Interactive Patient Education  2017 [employer]

## 2016-07-25 NOTE — Progress Notes (Signed)
Subjective:    Patient ID: Victoria Tanner, female    DOB: Feb 17, 1976, 40 y.o.   MRN: [id number]  HPI  40 year old female who presents to the office today for the complaint of abdominal bloating, "shooting" pain,and episodes of constipation. This has been an issue for the last three months but has been becoming worse over the last few weeks. The pain is located in the epigastric area and she has found that it is worse after eating and then subsides. She denies any fevers, diarrhea or feeling acutely ill. She has not noticed any blood in her stool   She has cut back on fried foods/fastr foods. She feels as though her appetite has decreased over the last week    She takes Zantcc when she feels as though she is having acid indigestion, which she reports helps.    Review of Systems  Constitutional: Positive for appetite change.  HENT: Negative.   Respiratory: Negative.   Cardiovascular: Negative.   Gastrointestinal: Positive for abdominal distention, abdominal pain, constipation and nausea. Negative for anal bleeding, blood in stool, diarrhea, rectal pain and vomiting.  All other systems reviewed and are negative.  Past Medical History:  Diagnosis Date  . Chicken pox   . Depression   . Migraines    once every few months    Social History   Social History  . Marital status: Married    Spouse name: N/A  . Number of children: N/A  . Years of education: N/A   Occupational History  . Not on file.   Social History Main Topics  . Smoking status: Never Smoker  . Smokeless tobacco: Not on file  . Alcohol use No  . Drug use: No  . Sexual activity: Not on file   Other Topics Concern  . 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    Married for 13 years   Two children    She likes to do photography and paint.     Past Surgical History:  Procedure Laterality Date  . SALPINGECTOMY Right 2008   partial, from  ectopic    Family History  Problem Relation Age of Onset  . Arthritis Maternal Grandmother   . Elevated Lipids Paternal Grandfather   . Elevated Lipids Maternal Grandmother   . Elevated Lipids Father   . Hypertension Mother   . Hypertension Maternal Grandmother   . Stroke Maternal Grandmother   . Stroke Paternal Grandmother   . Depression Mother   . OCD Mother   . ADD / ADHD Mother   . Depression Maternal Grandmother   . OCD Maternal Grandmother   . ADD / ADHD Maternal Grandmother   . Diabetes Paternal Grandfather     No Known Allergies  No current outpatient prescriptions on file prior to visit.   No current facility-administered medications on file prior to visit.     BP 126/64   Temp 98.2 F (36.8 C) (Oral)   Ht 5\' 5"  (1.651 m)   Wt 191 lb 6.4 oz (86.8 kg)   BMI 31.85 kg/m       Objective:   Physical Exam  Constitutional: She is oriented to person, place, and time. She appears well-developed and well-nourished. No distress.  Cardiovascular: Normal rate, regular rhythm, normal heart sounds and intact distal pulses.  Exam reveals no gallop and no friction rub.   No murmur heard. Pulmonary/Chest: Effort normal  and breath sounds normal. No respiratory distress. She has no wheezes. She has no rales. She exhibits no tenderness.  Abdominal: Soft. Normal appearance and bowel sounds are normal. She exhibits no distension and no mass. There is no hepatosplenomegaly, splenomegaly or hepatomegaly. There is tenderness in the epigastric area and periumbilical area. There is no rigidity, no rebound, no guarding, no CVA tenderness, no tenderness at McBurney's point and negative Murphy's sign.  Neurological: She is alert and oriented to person, place, and time.  Skin: Skin is warm and dry. No rash noted. She is not diaphoretic. No erythema. No pallor.  Psychiatric: She has a normal mood and affect. Her behavior is normal. Judgment and thought content normal.  Nursing note and  vitals reviewed.     Assessment & Plan:  1. Epigastric pain - Probable gastric ulcer. Will treat with protonix  - CBC with Differential/Platelet - Comprehensive metabolic panel - H. pylori antibody, IgG - Lipase - Urinalysis, Routine w reflex microscopic (not at [facility]) - pantoprazole (PROTONIX) 40 MG tablet; Take 1 tablet (40 mg total) by mouth daily.  Dispense: 30 tablet; Refill: 2 - Start taking Probiotic as well  - Follow up in 1-2 weeks if no improvement or sooner if symptoms become worse  Shirline Freesory Joedy Eickhoff, NP

## 2016-07-26 LAB — CBC WITH DIFFERENTIAL/PLATELET
Basophils Absolute: 0.1 10*3/uL (ref 0.0–0.1)
Basophils Relative: 1.9 % (ref 0.0–3.0)
Eosinophils Absolute: 0.1 10*3/uL (ref 0.0–0.7)
Eosinophils Relative: 1.9 % (ref 0.0–5.0)
HCT: 43.1 % (ref 36.0–46.0)
Hemoglobin: 14.6 g/dL (ref 12.0–15.0)
Lymphocytes Relative: 30 % (ref 12.0–46.0)
Lymphs Abs: 2.1 10*3/uL (ref 0.7–4.0)
MCHC: 34 g/dL (ref 30.0–36.0)
MCV: 86.6 fl (ref 78.0–100.0)
Monocytes Absolute: 0.4 10*3/uL (ref 0.1–1.0)
Monocytes Relative: 6 % (ref 3.0–12.0)
Neutro Abs: 4.1 10*3/uL (ref 1.4–7.7)
Neutrophils Relative %: 60.2 % (ref 43.0–77.0)
Platelets: 332 10*3/uL (ref 150.0–400.0)
RBC: 4.98 Mil/uL (ref 3.87–5.11)
RDW: 13.6 % (ref 11.5–15.5)
WBC: 6.8 10*3/uL (ref 4.0–10.5)

## 2016-10-27 ENCOUNTER — Other Ambulatory Visit: Payer: Self-pay | Admitting: Adult Health

## 2016-10-27 DIAGNOSIS — R1013 Epigastric pain: Secondary | ICD-10-CM

## 2016-11-10 DIAGNOSIS — J019 Acute sinusitis, unspecified: Secondary | ICD-10-CM | POA: Diagnosis not present

## 2016-11-25 DIAGNOSIS — B373 Candidiasis of vulva and vagina: Secondary | ICD-10-CM | POA: Diagnosis not present

## 2017-01-15 ENCOUNTER — Encounter: Payer: Self-pay | Admitting: Family Medicine

## 2017-01-15 ENCOUNTER — Ambulatory Visit (INDEPENDENT_AMBULATORY_CARE_PROVIDER_SITE_OTHER): Payer: BLUE CROSS/BLUE SHIELD | Admitting: Family Medicine

## 2017-01-15 ENCOUNTER — Telehealth: Payer: Self-pay | Admitting: Adult Health

## 2017-01-15 VITALS — BP 112/82 | HR 76 | Temp 98.4°F | Wt 195.0 lb

## 2017-01-15 DIAGNOSIS — H8110 Benign paroxysmal vertigo, unspecified ear: Secondary | ICD-10-CM | POA: Diagnosis not present

## 2017-01-15 MED ORDER — MECLIZINE HCL 32 MG PO TABS: 32.0000 mg | ORAL_TABLET | Freq: Three times a day (TID) | ORAL | 0 refills | Status: DC | PRN

## 2017-01-15 MED ORDER — MECLIZINE HCL 25 MG PO TABS: 25.0000 mg | ORAL_TABLET | Freq: Three times a day (TID) | ORAL | 0 refills | Status: DC | PRN

## 2017-01-15 NOTE — Patient Instructions (Addendum)
It was a pleasure to see you today. Please follow up with Kandee Keen if symptoms do not improve with treatment, worsen, or new symptoms develop.    Benign Positional Vertigo Vertigo is the feeling that you or your surroundings are moving when they are not. Benign positional vertigo is the most common form of vertigo. The cause of this condition is not serious (is benign). This condition is triggered by certain movements and positions (is positional). This condition can be dangerous if it occurs while you are doing something that could endanger you or others, such as driving. What are the causes? In many cases, the cause of this condition is not known. It may be caused by a disturbance in an area of the inner ear that helps your brain to sense movement and balance. This disturbance can be caused by a viral infection (labyrinthitis), head injury, or repetitive motion. What increases the risk? This condition is more likely to develop in:  Women.  People who are 47 years of age or older. What are the signs or symptoms? Symptoms of this condition usually happen when you move your head or your eyes in different directions. Symptoms may start suddenly, and they usually last for less than a minute. Symptoms may include:  Loss of balance and falling.  Feeling like you are spinning or moving.  Feeling like your surroundings are spinning or moving.  Nausea and vomiting.  Blurred vision.  Dizziness.  Involuntary eye movement (nystagmus). Symptoms can be mild and cause only slight annoyance, or they can be severe and interfere with daily life. Episodes of benign positional vertigo may return (recur) over time, and they may be triggered by certain movements. Symptoms may improve over time. How is this diagnosed? This condition is usually diagnosed by medical history and a physical exam of the head, neck, and ears. You may be referred to a health care provider who specializes in ear, nose, and throat  (ENT) problems (otolaryngologist) or a provider who specializes in disorders of the nervous system (neurologist). You may have additional testing, including:  MRI.  A CT scan.  Eye movement tests. Your health care provider may ask you to change positions quickly while he or she watches you for symptoms of benign positional vertigo, such as nystagmus. Eye movement may be tested with an electronystagmogram (ENG), caloric stimulation, the Dix-Hallpike test, or the roll test.  An electroencephalogram (EEG). This records electrical activity in your brain.  Hearing tests. How is this treated? Usually, your health care provider will treat this by moving your head in specific positions to adjust your inner ear back to normal. Surgery may be needed in severe cases, but this is rare. In some cases, benign positional vertigo may resolve on its own in 2-4 weeks. Follow these instructions at home: Safety   Move slowly.Avoid sudden body or head movements.  Avoid driving.  Avoid operating heavy machinery.  Avoid doing any tasks that would be dangerous to you or others if a vertigo episode would occur.  If you have trouble walking or keeping your balance, try using a cane for stability. If you feel dizzy or unstable, sit down right away.  Return to your normal activities as told by your health care provider. Ask your health care provider what activities are safe for you. General instructions   Take over-the-counter and prescription medicines only as told by your health care provider.  Avoid certain positions or movements as told by your health care provider.  Drink enough fluid  to keep your urine clear or pale yellow.  Keep all follow-up visits as told by your health care provider. This is important. Contact a health care provider if:  You have a fever.  Your condition gets worse or you develop new symptoms.  Your family or friends notice any behavioral changes.  Your nausea or vomiting  gets worse.  You have numbness or a "pins and needles" sensation. Get help right away if:  You have difficulty speaking or moving.  You are always dizzy.  You faint.  You develop severe headaches.  You have weakness in your legs or arms.  You have changes in your hearing or vision.  You develop a stiff neck.  You develop sensitivity to light. This information is not intended to replace advice given to you by your health care provider. Make sure you discuss any questions you have with your health care provider. Document Released: 05/22/2006 Document Revised: 01/20/2016 Document Reviewed: 12/07/2014 Elsevier Interactive Patient Education  2017 Elsevier Inc.   NOW OFFER    [facility] FAST TRACK!!!  SAME DAY Appointments for ACUTE CARE  Such as: Sprains, Injuries, cuts, abrasions, rashes, muscle pain, joint pain, back pain Colds, flu, sore throats, headache, allergies, cough, fever  Ear pain, sinus and eye infections Abdominal pain, nausea, vomiting, diarrhea, upset stomach Animal/insect bites  3 Easy Ways to Schedule: Walk-In Scheduling Call in scheduling Mychart Sign-up: [email or web address]

## 2017-01-15 NOTE — Telephone Encounter (Signed)
Pt is calling stat\ing that the pharmacy said the mg is incorrect and need to have it corrected before it can be dispensed.  Pharm:  [facility] on [address]

## 2017-01-15 NOTE — Progress Notes (Signed)
Subjective:    Patient ID: Victoria Tanner, female    DOB: 1976/05/11, 41 y.o.   MRN: [id number]  HPI  Victoria Tanner is a 41 year old year old who present two episodes of nausea and dizziness.  One episode occurred 5 days ago that caused vomiting x 2 which then resolved. Victoria Tanner reports that this occurred again yesterday with one episode of vomiting. Symptoms resolved in 10 minutes.  Symptoms appear to be associated with head movements. Victoria Tanner denies any visual disturbances or sinus pressure/pain, or ear pain.    Presyncope/syncope associated: No Evaluation of provoking factors that can cause symptoms below: Change in head position: yes Ear pressure: No Coughing/sneezing: No Valsalva: No Occur with standing/getting out of bed: No Lying down/rolling over in bed/bending neck: No Hearing loss: No  Denies HA, change in vision, numbness, weakness, abnormal coordination/gait, chest pain, palpitations, jaw pain, or arm pain History of cardiac dysfunction: No per patient Victoria Tanner denies pregnancy; LMP 01/06/17; husband has had a vasectomy No treatments have been tried at home.  Review of Systems  Constitutional: Negative for chills, fatigue and fever.  HENT: Negative for ear pain, postnasal drip, rhinorrhea, sinus pain and sinus pressure.   Respiratory: Negative for cough, shortness of breath and wheezing.   Cardiovascular: Negative for chest pain and palpitations.  Gastrointestinal: Positive for nausea. Negative for abdominal pain.  Musculoskeletal: Negative for myalgias.  Neurological: Positive for dizziness. Negative for seizures, weakness, light-headedness and numbness.   Past Medical History:  Diagnosis Date  . Chicken pox   . Depression   . Migraines    once every few months     Social History   Social History  . Marital status: Married    Spouse name: N/A  . Number of children: N/A  . Years of education: N/A   Occupational History  . Not on file.   Social History Main Topics  .  Smoking status: Never Smoker  . Smokeless tobacco: Never Used  . Alcohol use No  . Drug use: No  . Sexual activity: Not on file   Other Topics Concern  . 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    Married for 13 years   Two children    Victoria Tanner likes to do photography and paint.     Past Surgical History:  Procedure Laterality Date  . SALPINGECTOMY Right 2008   partial, from ectopic    Family History  Problem Relation Age of Onset  . Arthritis Maternal Grandmother   . Elevated Lipids Paternal Grandfather   . Elevated Lipids Maternal Grandmother   . Elevated Lipids Father   . Hypertension Mother   . Hypertension Maternal Grandmother   . Stroke Maternal Grandmother   . Stroke Paternal Grandmother   . Depression Mother   . OCD Mother   . ADD / ADHD Mother   . Depression Maternal Grandmother   . OCD Maternal Grandmother   . ADD / ADHD Maternal Grandmother   . Diabetes Paternal Grandfather     No Known Allergies  Current Outpatient Prescriptions on File Prior to Visit  Medication Sig Dispense Refill  . pantoprazole (PROTONIX) 40 MG tablet take 1 tablet by mouth once daily 30 tablet 2   No current facility-administered medications on file prior to visit.     BP 112/82   Pulse 76   Temp 98.4 F (36.9 C) (Oral)   Wt  195 lb (88.5 kg)   LMP 01/06/2017 (Exact Date)   SpO2 98%   BMI 32.45 kg/m        Objective:   Physical Exam  Constitutional: Victoria Tanner is oriented to person, place, and time. Victoria Tanner appears well-developed and well-nourished.  Eyes: Pupils are equal, round, and reactive to light. No scleral icterus.  Neck: Neck supple.  Cardiovascular: Normal rate, regular rhythm and intact distal pulses.   Pulmonary/Chest: Effort normal and breath sounds normal. Victoria Tanner has no wheezes. Victoria Tanner has no rales.  Lymphadenopathy:    Victoria Tanner has no cervical adenopathy.  Neurological: Victoria Tanner is alert and oriented to person, place,  and time.  II-Visual fields grossly intact. III/IV/VI-Extraocular movements intact. Pupils reactive bilaterally. V/VII-Smile symmetric, equal eyebrow raise, facial sensation intact VIII- Hearing grossly intact XI-bilateral shoulder shrug XII-midline tongue extension Motor: 5/5 bilaterally with normal tone and bulk Cerebellar: Normal finger-to-nose   Romberg negative Ambulates with a coordinated gait Subtle nystagmus noted during Dix-Hallpike  Skin: Skin is warm and dry. No rash noted.  Psychiatric: Victoria Tanner has a normal mood and affect. Victoria Tanner behavior is normal. Judgment and thought content normal.      Assessment & Plan:  1. Benign paroxysmal positional vertigo, unspecified laterality Exam is reassuring; suggestive of BPPV; provided written information and meclizine. Discussed that this usually resolves in 2 to 4 weeks and follow up for further evaluation if symptoms do not improve, worsen, or Victoria Tanner develops new symptoms that are noted in written instructions provided. - meclizine (ANTIVERT) 32 MG tablet; Take 1 tablet (32 mg total) by mouth 3 (three) times daily as needed.  Dispense: 30 tablet; Refill: 0  Roddie McJulia Hollynn Garno, FNP-C

## 2017-01-15 NOTE — Telephone Encounter (Signed)
Rx for Antivert 25 mg has been e scribed to patient pharmacy. Called to confirm.

## 2017-01-15 NOTE — Telephone Encounter (Signed)
Awaiting on Amil AmenJulia to confirm the dose strength.

## 2017-02-09 ENCOUNTER — Other Ambulatory Visit: Payer: Self-pay | Admitting: Adult Health

## 2017-02-09 DIAGNOSIS — R1013 Epigastric pain: Secondary | ICD-10-CM

## 2017-05-18 ENCOUNTER — Encounter: Payer: Self-pay | Admitting: Adult Health

## 2017-06-18 DIAGNOSIS — Z130 Encounter for screening for diseases of the blood and blood-forming organs and certain disorders involving the immune mechanism: Secondary | ICD-10-CM | POA: Diagnosis not present

## 2017-06-18 DIAGNOSIS — Z1231 Encounter for screening mammogram for malignant neoplasm of breast: Secondary | ICD-10-CM | POA: Diagnosis not present

## 2017-06-18 DIAGNOSIS — Z13220 Encounter for screening for lipoid disorders: Secondary | ICD-10-CM | POA: Diagnosis not present

## 2017-06-18 DIAGNOSIS — Z6833 Body mass index (BMI) 33.0-33.9, adult: Secondary | ICD-10-CM | POA: Diagnosis not present

## 2017-06-18 DIAGNOSIS — Z1329 Encounter for screening for other suspected endocrine disorder: Secondary | ICD-10-CM | POA: Diagnosis not present

## 2017-06-18 DIAGNOSIS — Z01419 Encounter for gynecological examination (general) (routine) without abnormal findings: Secondary | ICD-10-CM | POA: Diagnosis not present

## 2017-06-18 DIAGNOSIS — Z124 Encounter for screening for malignant neoplasm of cervix: Secondary | ICD-10-CM | POA: Diagnosis not present

## 2017-06-20 ENCOUNTER — Encounter: Payer: Self-pay | Admitting: Family Medicine

## 2017-06-20 DIAGNOSIS — Z23 Encounter for immunization: Secondary | ICD-10-CM | POA: Diagnosis not present

## 2017-07-23 DIAGNOSIS — J020 Streptococcal pharyngitis: Secondary | ICD-10-CM | POA: Diagnosis not present

## 2017-08-31 DIAGNOSIS — N390 Urinary tract infection, site not specified: Secondary | ICD-10-CM | POA: Diagnosis not present

## 2017-10-17 DIAGNOSIS — R350 Frequency of micturition: Secondary | ICD-10-CM | POA: Diagnosis not present

## 2017-10-17 DIAGNOSIS — Z6834 Body mass index (BMI) 34.0-34.9, adult: Secondary | ICD-10-CM | POA: Diagnosis not present

## 2017-11-01 DIAGNOSIS — K601 Chronic anal fissure: Secondary | ICD-10-CM | POA: Diagnosis not present

## 2017-11-01 DIAGNOSIS — R1310 Dysphagia, unspecified: Secondary | ICD-10-CM | POA: Diagnosis not present

## 2017-11-01 DIAGNOSIS — K219 Gastro-esophageal reflux disease without esophagitis: Secondary | ICD-10-CM | POA: Diagnosis not present

## 2017-11-01 DIAGNOSIS — R635 Abnormal weight gain: Secondary | ICD-10-CM | POA: Diagnosis not present

## 2017-11-06 ENCOUNTER — Encounter: Payer: Self-pay | Admitting: Family Medicine

## 2017-11-19 DIAGNOSIS — K449 Diaphragmatic hernia without obstruction or gangrene: Secondary | ICD-10-CM | POA: Diagnosis not present

## 2017-11-19 DIAGNOSIS — K210 Gastro-esophageal reflux disease with esophagitis: Secondary | ICD-10-CM | POA: Diagnosis not present

## 2017-11-19 DIAGNOSIS — K200 Eosinophilic esophagitis: Secondary | ICD-10-CM | POA: Diagnosis not present

## 2017-11-19 DIAGNOSIS — R1310 Dysphagia, unspecified: Secondary | ICD-10-CM | POA: Diagnosis not present

## 2017-11-19 DIAGNOSIS — R1033 Periumbilical pain: Secondary | ICD-10-CM | POA: Diagnosis not present

## 2017-11-20 ENCOUNTER — Encounter: Payer: Self-pay | Admitting: Adult Health

## 2017-12-18 DIAGNOSIS — K200 Eosinophilic esophagitis: Secondary | ICD-10-CM | POA: Diagnosis not present

## 2017-12-18 DIAGNOSIS — E669 Obesity, unspecified: Secondary | ICD-10-CM | POA: Diagnosis not present

## 2017-12-18 DIAGNOSIS — R1310 Dysphagia, unspecified: Secondary | ICD-10-CM | POA: Diagnosis not present

## 2017-12-18 DIAGNOSIS — K210 Gastro-esophageal reflux disease with esophagitis: Secondary | ICD-10-CM | POA: Diagnosis not present

## 2018-01-23 DIAGNOSIS — H5213 Myopia, bilateral: Secondary | ICD-10-CM | POA: Diagnosis not present

## 2018-08-09 DIAGNOSIS — Z6833 Body mass index (BMI) 33.0-33.9, adult: Secondary | ICD-10-CM | POA: Diagnosis not present

## 2018-08-09 DIAGNOSIS — Z01419 Encounter for gynecological examination (general) (routine) without abnormal findings: Secondary | ICD-10-CM | POA: Diagnosis not present

## 2018-08-09 DIAGNOSIS — Z1231 Encounter for screening mammogram for malignant neoplasm of breast: Secondary | ICD-10-CM | POA: Diagnosis not present

## 2018-08-09 DIAGNOSIS — Z124 Encounter for screening for malignant neoplasm of cervix: Secondary | ICD-10-CM | POA: Diagnosis not present

## 2018-09-24 DIAGNOSIS — Z23 Encounter for immunization: Secondary | ICD-10-CM | POA: Diagnosis not present

## 2018-09-24 DIAGNOSIS — Z0184 Encounter for antibody response examination: Secondary | ICD-10-CM | POA: Diagnosis not present

## 2018-09-26 DIAGNOSIS — Z23 Encounter for immunization: Secondary | ICD-10-CM | POA: Diagnosis not present

## 2019-01-15 DIAGNOSIS — Z03818 Encounter for observation for suspected exposure to other biological agents ruled out: Secondary | ICD-10-CM | POA: Diagnosis not present

## 2019-08-01 DIAGNOSIS — Z111 Encounter for screening for respiratory tuberculosis: Secondary | ICD-10-CM | POA: Diagnosis not present

## 2019-08-01 DIAGNOSIS — Z23 Encounter for immunization: Secondary | ICD-10-CM | POA: Diagnosis not present

## 2019-08-25 DIAGNOSIS — H5213 Myopia, bilateral: Secondary | ICD-10-CM | POA: Diagnosis not present

## 2019-10-23 ENCOUNTER — Ambulatory Visit: Payer: Self-pay | Attending: Internal Medicine

## 2019-10-23 ENCOUNTER — Other Ambulatory Visit: Payer: Self-pay

## 2019-10-23 DIAGNOSIS — Z23 Encounter for immunization: Secondary | ICD-10-CM | POA: Insufficient documentation

## 2019-10-23 NOTE — Progress Notes (Signed)
   Covid-19 Vaccination Clinic  Name:  Victoria Tanner    MRN: [id number] DOB: 06-05-1976  10/23/2019  [name] was observed post Covid-19 immunization for 15 minutes without incidence. She was provided with Vaccine Information Sheet and instruction to access the V-Safe system.   [name] was instructed to call 911 with any severe reactions post vaccine: Marland Kitchen Difficulty breathing  . Swelling of your face and throat  . A fast heartbeat  . A bad rash all over your body  . Dizziness and weakness    Immunizations Administered    Name Date Dose VIS Date Route   Moderna COVID-19 Vaccine 10/23/2019  3:28 PM 0.5 mL 07/29/2019 Intramuscular   Manufacturer: Moderna   Lot: [id number]   NDC: 25910-289-02

## 2019-11-25 ENCOUNTER — Ambulatory Visit: Payer: Self-pay | Attending: Internal Medicine

## 2019-11-25 DIAGNOSIS — Z23 Encounter for immunization: Secondary | ICD-10-CM

## 2019-11-25 NOTE — Progress Notes (Signed)
   Covid-19 Vaccination Clinic  Name:  Dera Vanaken    MRN: [id number] DOB: 14-Jun-1976  11/25/2019  Ms. Bauers was observed post Covid-19 immunization for 15 minutes without incident. She was provided with Vaccine Information Sheet and instruction to access the V-Safe system.   Ms. Hosek was instructed to call 911 with any severe reactions post vaccine: Marland Kitchen Difficulty breathing  . Swelling of face and throat  . A fast heartbeat  . A bad rash all over body  . Dizziness and weakness   Immunizations Administered    Name Date Dose VIS Date Route   Moderna COVID-19 Vaccine 11/25/2019  3:32 PM 0.5 mL 07/29/2019 Intramuscular   Manufacturer: Moderna   Lot: [id number]   NDC: 34742-595-63

## 2020-05-20 ENCOUNTER — Other Ambulatory Visit: Payer: Self-pay

## 2020-06-24 ENCOUNTER — Ambulatory Visit: Payer: BC Managed Care – PPO | Admitting: Adult Health

## 2020-06-24 ENCOUNTER — Ambulatory Visit (INDEPENDENT_AMBULATORY_CARE_PROVIDER_SITE_OTHER): Payer: BC Managed Care – PPO

## 2020-06-24 ENCOUNTER — Other Ambulatory Visit: Payer: Self-pay

## 2020-06-24 ENCOUNTER — Encounter: Payer: Self-pay | Admitting: Adult Health

## 2020-06-24 VITALS — BP 102/70 | HR 85 | Temp 98.6°F | Ht 65.0 in | Wt 210.4 lb

## 2020-06-24 DIAGNOSIS — R1030 Lower abdominal pain, unspecified: Secondary | ICD-10-CM

## 2020-06-24 LAB — COMPREHENSIVE METABOLIC PANEL
ALT: 12 U/L (ref 0–35)
AST: 14 U/L (ref 0–37)
Albumin: 4.2 g/dL (ref 3.5–5.2)
Alkaline Phosphatase: 93 U/L (ref 39–117)
BUN: 9 mg/dL (ref 6–23)
CO2: 29 mEq/L (ref 19–32)
Calcium: 9.1 mg/dL (ref 8.4–10.5)
Chloride: 103 mEq/L (ref 96–112)
Creatinine, Ser: 0.69 mg/dL (ref 0.40–1.20)
GFR: 104.84 mL/min (ref >60.00)
Glucose, Bld: 84 mg/dL (ref 70–99)
Potassium: 4.3 mEq/L (ref 3.5–5.1)
Sodium: 139 mEq/L (ref 135–145)
Total Bilirubin: 0.4 mg/dL (ref 0.2–1.2)
Total Protein: 6.3 g/dL (ref 6.0–8.3)

## 2020-06-24 LAB — POCT URINALYSIS DIPSTICK
Bilirubin, UA: NEGATIVE
Glucose, UA: NEGATIVE
Ketones, UA: NEGATIVE
Leukocytes, UA: NEGATIVE
Nitrite, UA: NEGATIVE
Protein, UA: NEGATIVE
Spec Grav, UA: 1.015 (ref 1.010–1.025)
Urobilinogen, UA: 0.2 E.U./dL
pH, UA: 7 (ref 5.0–8.0)

## 2020-06-24 LAB — CBC WITH DIFFERENTIAL/PLATELET
Basophils Absolute: 0.1 10*3/uL (ref 0.0–0.1)
Basophils Relative: 0.9 % (ref 0.0–3.0)
Eosinophils Absolute: 0.2 10*3/uL (ref 0.0–0.7)
Eosinophils Relative: 4 % (ref 0.0–5.0)
HCT: 37.1 % (ref 36.0–46.0)
Hemoglobin: 11.9 g/dL — ABNORMAL LOW (ref 12.0–15.0)
Lymphocytes Relative: 29.2 % (ref 12.0–46.0)
Lymphs Abs: 1.7 10*3/uL (ref 0.7–4.0)
MCHC: 32 g/dL (ref 30.0–36.0)
MCV: 76.1 fl — ABNORMAL LOW (ref 78.0–100.0)
Monocytes Absolute: 0.4 10*3/uL (ref 0.1–1.0)
Monocytes Relative: 7.1 % (ref 3.0–12.0)
Neutro Abs: 3.4 10*3/uL (ref 1.4–7.7)
Neutrophils Relative %: 58.8 % (ref 43.0–77.0)
Platelets: 405 10*3/uL — ABNORMAL HIGH (ref 150.0–400.0)
RBC: 4.87 Mil/uL (ref 3.87–5.11)
RDW: 15.8 % — ABNORMAL HIGH (ref 11.5–15.5)
WBC: 5.8 10*3/uL (ref 4.0–10.5)

## 2020-06-24 LAB — POCT URINE PREGNANCY: Preg Test, Ur: NEGATIVE

## 2020-06-24 NOTE — Addendum Note (Signed)
Addended by: Lerry Liner on: 06/24/2020 08:04 AM   Modules accepted: Orders

## 2020-06-24 NOTE — Progress Notes (Signed)
Subjective:    Patient ID: Victoria Tanner, female    DOB: 03/21/1976, 44 y.o.   MRN: [id number]  HPI 44 year old female who  has a past medical history of Chicken pox, Depression, and Migraines.   She presents to the office today for an acute issue of lower left  abdominal pain x 1 week.  Pain is felt as a "mild sharp" and is rated 3 out of 10.  Pain is worse when bending, twisting, or doing certain range of motion.  When she stands she can "feel a lump".  Has not noticed any issues with bowel movements, her last bowel movement was this morning and was normal.  She denies fevers, chills, UTI-like symptoms, vaginal discharge, or concerns for sexually transmitted diseases.  Her periods have been heavier  Review of Systems See HPI   Past Medical History:  Diagnosis Date   Chicken pox    Depression    Migraines    once every few months    Social History   Socioeconomic History   Marital status: Married    Spouse name: Not on file   Number of children: Not on file   Years of education: Not on file   Highest education level: Not on file  Occupational History   Not on file  Tobacco Use   Smoking status: Never Smoker   Smokeless tobacco: Never Used  Substance and Sexual Activity   Alcohol use: No    Alcohol/week: 0.0 standard drinks   Drug use: No   Sexual activity: Not on file  Other Topics Concern   Not on file  Social History Narrative   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    Married for 13 years   Two children    She likes to do photography and paint.    Social Determinants of Health   Financial Resource Strain:    Difficulty of Paying Living Expenses: Not on file  Food Insecurity:    Worried About Charity fundraiser in the Last Year: Not on file   [employer] of Food in the Last Year: Not on file  Transportation Needs:    Lack of Transportation (Medical): Not on file   Lack of Transportation (Non-Medical): Not on file    Physical Activity:    Days of Exercise per Week: Not on file   Minutes of Exercise per Session: Not on file  Stress:    Feeling of Stress : Not on file  Social Connections:    Frequency of Communication with Friends and Family: Not on file   Frequency of Social Gatherings with Friends and Family: Not on file   Attends Religious Services: Not on file   Active Member of Clubs or Organizations: Not on file   Attends Archivist Meetings: Not on file   Marital Status: Not on file  Intimate Partner Violence:    Fear of Current or Ex-Partner: Not on file   Emotionally Abused: Not on file   Physically Abused: Not on file   Sexually Abused: Not on file    Past Surgical History:  Procedure Laterality Date   SALPINGECTOMY Right 2008   partial, from ectopic    Family History  Problem Relation Age of Onset   Arthritis Maternal Grandmother    Elevated Lipids Paternal Grandfather    Elevated Lipids Maternal Grandmother    Elevated Lipids Father    Hypertension Mother    Hypertension Maternal  Grandmother    Stroke Maternal Grandmother    Stroke Paternal Grandmother    Depression Mother    OCD Mother    ADD / ADHD Mother    Depression Maternal Grandmother    OCD Maternal Grandmother    ADD / ADHD Maternal Grandmother    Diabetes Paternal Grandfather     No Known Allergies  Current Outpatient Medications on File Prior to Visit  Medication Sig Dispense Refill   omeprazole (PRILOSEC) 40 MG capsule Take 40 mg by mouth daily. Take 20 minutes before breakfast     No current facility-administered medications on file prior to visit.    BP 102/70 (BP Location: Left Arm, Patient Position: Sitting, Cuff Size: Large)    Pulse 85    Temp 98.6 F (37 C) (Oral)    Ht 5' 5"  (1.651 m)    Wt 210 lb 6.4 oz (95.4 kg)    LMP 06/19/2020 (Exact Date)    BMI 35.01 kg/m       Objective:   Physical Exam Vitals and nursing note reviewed.  Constitutional:       Appearance: She is well-developed. She is obese.  Cardiovascular:     Rate and Rhythm: Normal rate and regular rhythm.     Pulses: Normal pulses.     Heart sounds: Normal heart sounds.  Pulmonary:     Effort: Pulmonary effort is normal.     Breath sounds: Normal breath sounds.  Abdominal:     General: Abdomen is flat. Bowel sounds are normal.     Palpations: Abdomen is soft. There is mass (questionable in left lower quadrant).     Tenderness: There is abdominal tenderness in the right lower quadrant and left lower quadrant. There is no right CVA tenderness, left CVA tenderness or guarding. Negative signs include Murphy's sign and McBurney's sign.     Hernia: No hernia is present.  Neurological:     Mental Status: She is alert.       Assessment & Plan:  1. Lower abdominal pain -Questionable hard mass felt in the left lower quadrant, hard to tell if this is stool or something else.  We will start with work and KUB.  Likely need CT of abdomen and pelvis in the future. - CBC with Differential/Platelet; Future - CMP with eGFR([facility]); Future - DG Abd 1 View; Future - POC Urinalysis Dipstick; Future - POCT urine pregnancy; Future  Dorothyann Peng, NP

## 2020-06-24 NOTE — Addendum Note (Signed)
Addended by: Lerry Liner on: 06/24/2020 08:05 AM   Modules accepted: Orders

## 2020-06-24 NOTE — Progress Notes (Signed)
ab 

## 2020-09-05 ENCOUNTER — Other Ambulatory Visit: Payer: Self-pay

## 2020-09-05 ENCOUNTER — Encounter (HOSPITAL_COMMUNITY): Payer: Self-pay

## 2020-09-05 ENCOUNTER — Emergency Department (HOSPITAL_COMMUNITY)
Admission: EM | Admit: 2020-09-05 | Discharge: 2020-09-05 | Disposition: A | Discharge status: Home / Self Care | Payer: BC Managed Care – PPO | Attending: Emergency Medicine | Admitting: Emergency Medicine

## 2020-09-05 DIAGNOSIS — Z23 Encounter for immunization: Secondary | ICD-10-CM | POA: Insufficient documentation

## 2020-09-05 DIAGNOSIS — S0990XA Unspecified injury of head, initial encounter: Secondary | ICD-10-CM | POA: Diagnosis present

## 2020-09-05 DIAGNOSIS — S0101XA Laceration without foreign body of scalp, initial encounter: Secondary | ICD-10-CM | POA: Diagnosis not present

## 2020-09-05 DIAGNOSIS — W208XXA Other cause of strike by thrown, projected or falling object, initial encounter: Secondary | ICD-10-CM | POA: Insufficient documentation

## 2020-09-05 DIAGNOSIS — Y93E2 Activity, laundry: Secondary | ICD-10-CM | POA: Insufficient documentation

## 2020-09-05 MED ORDER — ACETAMINOPHEN 325 MG PO TABS
650.0000 mg | ORAL_TABLET | Freq: Once | ORAL | Status: AC
  Administered 2020-09-05: 650 mg via ORAL
  Filled 2020-09-05: qty 2

## 2020-09-05 MED ORDER — TETANUS-DIPHTH-ACELL PERTUSSIS 5-2.5-18.5 LF-MCG/0.5 IM SUSY
0.5000 mL | PREFILLED_SYRINGE | Freq: Once | INTRAMUSCULAR | Status: AC
  Administered 2020-09-05: 0.5 mL via INTRAMUSCULAR
  Filled 2020-09-05: qty 0.5

## 2020-09-05 NOTE — ED Triage Notes (Signed)
Pt sts doing laundry around 2030 and a light fixture fell on head. Bleeding noted but unable to visualize laceration.

## 2020-09-05 NOTE — ED Provider Notes (Signed)
[facility]-EMERGENCY DEPT Provider Note   CSN: [id number] Arrival date & time: 09/05/20  2050     History Chief Complaint  Patient presents with  . Head Laceration    Victoria Tanner is a 45 y.o. female with a past medical history that presents to the emergency department today for head laceration.  Patient states that she was doing laundry around 1030 tonight and a light fixture fell on her head.  Patient states that it did not break on her head, did break on the floor.  Patient states that besides having head pain she has no other symptoms.  Denies any vision changes, nausea, vomiting, numbness, tingling, weakness, abnormal gait.  Patient states that she was in her normal health before this.  Denies any blood thinners.  Is unsure when her last Tdap was.  Patient states that she did have some bleeding around her head.  No other complaints at this time.  Did not hit herself anywhere else.  Denies any loss of consciousness.  Denies any previous injuries to this area.  HPI     Past Medical History:  Diagnosis Date  . Chicken pox   . Depression   . Migraines    once every few months    There are no problems to display for this patient.   Past Surgical History:  Procedure Laterality Date  . SALPINGECTOMY Right 2008   partial, from ectopic     OB History   No obstetric history on file.     Family History  Problem Relation Age of Onset  . Hypertension Mother   . Depression Mother   . OCD Mother   . ADD / ADHD Mother   . Elevated Lipids Father   . Arthritis Maternal Grandmother   . Elevated Lipids Maternal Grandmother   . Hypertension Maternal Grandmother   . Stroke Maternal Grandmother   . Depression Maternal Grandmother   . OCD Maternal Grandmother   . ADD / ADHD Maternal Grandmother   . Stroke Paternal Grandmother   . Elevated Lipids Paternal Grandfather   . Diabetes Paternal Grandfather     Social History   Tobacco Use  . Smoking  status: Never Smoker  . Smokeless tobacco: Never Used  Substance Use Topics  . Alcohol use: No    Alcohol/week: 0.0 standard drinks  . Drug use: No    Home Medications Prior to Admission medications   Medication Sig Start Date End Date Taking? Authorizing Provider  omeprazole (PRILOSEC) 40 MG capsule Take 40 mg by mouth daily. Take 20 minutes before breakfast    Charna Elizabeth, MD    Allergies    No known allergies  Review of Systems   Review of Systems  Constitutional: Negative for diaphoresis, fatigue and fever.  HENT: Negative for sinus pressure.   Eyes: Negative for photophobia, pain and visual disturbance.  Respiratory: Negative for shortness of breath.   Cardiovascular: Negative for chest pain.  Gastrointestinal: Negative for nausea and vomiting.  Genitourinary: Negative for dysuria, enuresis and hematuria.  Musculoskeletal: Negative for back pain, gait problem and myalgias.  Skin: Positive for wound. Negative for color change, pallor and rash.  Neurological: Negative for syncope, weakness, light-headedness, numbness and headaches.  Psychiatric/Behavioral: Negative for behavioral problems, confusion and self-injury. The patient is not nervous/anxious and is not hyperactive.     Physical Exam Updated Vital Signs BP (!) 134/91 (BP Location: Right Arm)   Pulse 71   Temp 98.5 F (36.9 C) (Oral)  Resp 16   Ht 5\' 5"  (1.651 m)   Wt 93 kg   SpO2 98%   BMI 34.11 kg/m   Physical Exam Constitutional:      General: She is not in acute distress.    Appearance: Normal appearance. She is not ill-appearing, toxic-appearing or diaphoretic.  HENT:     Head: Normocephalic.      Comments: Patient with 3 mm laceration to mid scalp as depicted above.  Bleeding has been controlled, if the area is pressed then bleeding resumes.  Bleeding is not pulsatile.  No other lacerations noted. Cardiovascular:     Rate and Rhythm: Normal rate and regular rhythm.     Pulses: Normal pulses.   Pulmonary:     Effort: Pulmonary effort is normal.     Breath sounds: Normal breath sounds.  Musculoskeletal:        General: Normal range of motion.  Skin:    General: Skin is warm and dry.     Capillary Refill: Capillary refill takes less than 2 seconds.  Neurological:     General: No focal deficit present.     Mental Status: She is alert and oriented to person, place, and time.     Comments: Alert. Clear speech. No facial droop. CNIII-XII grossly intact. Bilateral upper and lower extremities' sensation grossly intact. 5/5 symmetric strength with grip strength and with plantar and dorsi flexion bilaterally. Normal finger to nose bilaterally. Negative pronator drift. Negative Romberg sign. Gait is steady and intact   Psychiatric:        Mood and Affect: Mood normal.        Behavior: Behavior normal.        Thought Content: Thought content normal.     ED Results / Procedures / Treatments   Labs (all labs ordered are listed, but only abnormal results are displayed) Labs Reviewed - No data to display  EKG None  Radiology No results found.  Procedures . Laceration Repair  Date/Time: 09/05/2020 10:28 PM Performed by: 11/03/2020, PA-C Authorized by: Farrel Gordon, PA-C   Consent:    Consent obtained:  Verbal   Consent given by:  Patient   Risks, benefits, and alternatives were discussed: yes     Risks discussed:  Infection and pain   Alternatives discussed:  No treatment Universal protocol:    Patient identity confirmed:  Verbally with patient Anesthesia:    Anesthesia method:  None Laceration details:    Location:  Scalp   Scalp location:  Mid-scalp   Length (cm):  0.5   Depth (mm):  2 Exploration:    Limited defect created (wound extended): no     Hemostasis achieved with:  Direct pressure   Imaging outcome: foreign body not noted     Wound exploration: wound explored through full range of motion and entire depth of wound visualized   Treatment:    Area  cleansed with:  Saline and povidone-iodine   Amount of cleaning:  Extensive   Debridement:  None Skin repair:    Repair method:  Staples   Number of staples:  1 Repair type:    Repair type:  Simple Post-procedure details:    Dressing:  Open (no dressing)   (including critical care time)  Medications Ordered in ED Medications  Tdap (BOOSTRIX) injection 0.5 mL (0.5 mLs Intramuscular Given 09/05/20 2202)  acetaminophen (TYLENOL) tablet 650 mg (650 mg Oral Given 09/05/20 2201)    ED Course  I have reviewed the triage vital signs and  the nursing notes.  Pertinent labs & imaging results that were available during my care of the patient were reviewed by me and considered in my medical decision making (see chart for details).    MDM Rules/Calculators/A&P                          Victoria Tanner is a 45 y.o. female with a past medical history that presents to the emergency department today for head laceration.  Patient with very small laceration, about a half a centimeter to mid scalp.  Bleeding has been controlled.  Was able to use 1 staple, patient tolerated procedure well.  No need for CT at this time, patient has a normal neuro exam.  No red flag symptoms.  Patient updated on Tdap, pain controlled.  Normal vitals.  Patient to be discharged at this time with follow-up with PCP.  Patient agreeable.  Strict return precautions given.   Doubt need for further emergent work up at this time. I explained the diagnosis and have given explicit precautions to return to the ER including for any other new or worsening symptoms. The patient understands and accepts the medical plan as it's been dictated and I have answered their questions. Discharge instructions concerning home care and prescriptions have been given. The patient is STABLE and is discharged to home in good condition.   Final Clinical Impression(s) / ED Diagnoses Final diagnoses:  Laceration of scalp, initial encounter    Rx / DC  Orders ED Discharge Orders    None       Farrel Gordon, PA-C 09/05/20 2244    Rozelle Logan, DO 09/05/20 2341

## 2020-09-05 NOTE — Discharge Instructions (Addendum)
You were seen today for a head laceration, you were updated on your tetanus shot today.  If you have any new or worsening concerning symptoms please come back to the emergency department.  Please take Tylenol as directed on the bottle for pain.  I want you to follow-up with your primary care provider.  Please use the attached instructions.  WOUND CARE Please return in 7 days to have your stitches/staples removed or sooner if you have concerns.  Keep area clean and dry for 24 hours. Do not remove bandage, if applied.  After 24 hours, remove bandage and wash wound gently with mild soap and warm water. Reapply a new bandage after cleaning wound, if directed.  Continue daily cleansing with soap and water until stitches/staples are removed.  Do not apply any ointments or creams to the wound while stitches/staples are in place, as this may cause delayed healing.  Notify the office if you experience any of the following signs of infection: Swelling, redness, pus drainage, streaking, fever >101.0 F  Notify the office if you experience excessive bleeding that does not stop after 15-20 minutes of constant, firm pressure.   Get help right away if: You have: A very bad headache that is not helped by medicine. Trouble walking or weakness in your arms and legs. Clear or bloody fluid coming from your nose or ears. Changes in how you see (vision). A seizure. More confusion or more grumpy moods. Your symptoms get worse. You are sleepier than normal and have trouble staying awake. You lose your balance. The black centers of your eyes (pupils) change in size. Your speech is slurred. Your dizziness gets worse. You vomit.

## 2020-09-14 ENCOUNTER — Ambulatory Visit: Payer: BC Managed Care – PPO | Admitting: Adult Health

## 2020-09-23 ENCOUNTER — Encounter: Payer: Self-pay | Admitting: Adult Health

## 2020-09-24 ENCOUNTER — Other Ambulatory Visit (INDEPENDENT_AMBULATORY_CARE_PROVIDER_SITE_OTHER): Payer: BC Managed Care – PPO

## 2020-09-24 ENCOUNTER — Telehealth: Payer: Self-pay | Admitting: Adult Health

## 2020-09-24 ENCOUNTER — Other Ambulatory Visit: Payer: Self-pay | Admitting: Adult Health

## 2020-09-24 ENCOUNTER — Other Ambulatory Visit: Payer: Self-pay

## 2020-09-24 DIAGNOSIS — R1030 Lower abdominal pain, unspecified: Secondary | ICD-10-CM

## 2020-09-24 LAB — BASIC METABOLIC PANEL
BUN: 14 mg/dL (ref 6–23)
CO2: 26 mEq/L (ref 19–32)
Calcium: 9.3 mg/dL (ref 8.4–10.5)
Chloride: 103 mEq/L (ref 96–112)
Creatinine, Ser: 0.71 mg/dL (ref 0.40–1.20)
GFR: 102.54 mL/min (ref >60.00)
Glucose, Bld: 79 mg/dL (ref 70–99)
Potassium: 4.4 mEq/L (ref 3.5–5.1)
Sodium: 138 mEq/L (ref 135–145)

## 2020-09-24 NOTE — Telephone Encounter (Signed)
Crystal w/[facility] is calling to get a new order for a Reg CT for ABD and Pelvis w/contrast.

## 2020-09-24 NOTE — Telephone Encounter (Signed)
I have scheduled the pt for lab work.  Nothing further needed.  Pt informed of Cory's message.

## 2020-09-24 NOTE — Addendum Note (Signed)
Addended by: Lerry Liner on: 09/24/2020 10:22 AM   Modules accepted: Orders

## 2020-10-08 ENCOUNTER — Ambulatory Visit
Admission: RE | Admit: 2020-10-08 | Discharge: 2020-10-08 | Disposition: A | Discharge status: Home / Self Care | Payer: BC Managed Care – PPO | Source: Ambulatory Visit | Attending: Adult Health | Admitting: Adult Health

## 2020-10-08 DIAGNOSIS — R1030 Lower abdominal pain, unspecified: Secondary | ICD-10-CM

## 2020-10-08 MED ORDER — IOPAMIDOL (ISOVUE-300) INJECTION 61%
100.0000 mL | Freq: Once | INTRAVENOUS | Status: AC | PRN
  Administered 2020-10-08: 100 mL via INTRAVENOUS

## 2020-10-19 ENCOUNTER — Ambulatory Visit: Payer: BC Managed Care – PPO | Admitting: Adult Health

## 2020-10-19 ENCOUNTER — Other Ambulatory Visit: Payer: Self-pay

## 2020-10-19 ENCOUNTER — Encounter: Payer: Self-pay | Admitting: Adult Health

## 2020-10-19 VITALS — BP 116/78 | Temp 97.9°F | Wt 199.0 lb

## 2020-10-19 DIAGNOSIS — R1033 Periumbilical pain: Secondary | ICD-10-CM | POA: Diagnosis not present

## 2020-10-20 NOTE — Progress Notes (Signed)
Subjective:    Patient ID: Victoria Tanner, female    DOB: 30-Dec-1975, 45 y.o.   MRN: [id number]  HPI 45 year old female who  has a past medical history of Chicken pox, Depression, and Migraines.  She presents to the office today for follow-up regarding abdominal pain.  She was first evaluated for this complaint back in October 2021 and it had been present for roughly 1 week.  The pain at this time was felt as "a mild sharp "pain and was rated 3 out of 10.  Pain was worse with bending, twisting, or certain range of motion.  When she stood up she could "feel a lump".  She had no changes in bowel movements, denied fevers, chills, UTI-like symptoms.  Lab work and KUB were reassuring.  CT of the abdomen and pelvis was ordered this was done approximately 2 weeks ago.  CT scan showed no acute abdomen or pelvis abnormality besides a small fat-containing umbilical hernia.  Patient reports that she continues to have intermittent mild sharp pain at left lower umbilicus.  Pain is very brief in duration.  Continues to be with certain range of motion's as well as walking.  Continues to deny changes in bowel movements, fevers, chills.  She does have a history of GERD, takes Prilosec as needed but not on a regular basis    Review of Systems See HPI   Past Medical History:  Diagnosis Date  . Chicken pox   . Depression   . Migraines    once every few months    Social History   Socioeconomic History  . Marital status: Married    Spouse name: Not on file  . Number of children: Not on file  . Years of education: Not on file  . Highest education level: Not on file  Occupational History  . Not on file  Tobacco Use  . Smoking status: Never Smoker  . Smokeless tobacco: Never Used  Substance and Sexual Activity  . Alcohol use: No    Alcohol/week: 0.0 standard drinks  . Drug use: No  . Sexual activity: Not on file  Other Topics Concern  . 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    Married for 13 years   Two children    She likes to do photography and paint.    Social Determinants of Health   Financial Resource Strain: Not on file  Food Insecurity: Not on file  Transportation Needs: Not on file  Physical Activity: Not on file  Stress: Not on file  Social Connections: Not on file  Intimate Partner Violence: Not on file    Past Surgical History:  Procedure Laterality Date  . SALPINGECTOMY Right 2008   partial, from ectopic    Family History  Problem Relation Age of Onset  . Hypertension Mother   . Depression Mother   . OCD Mother   . ADD / ADHD Mother   . Elevated Lipids Father   . Arthritis Maternal Grandmother   . Elevated Lipids Maternal Grandmother   . Hypertension Maternal Grandmother   . Stroke Maternal Grandmother   . Depression Maternal Grandmother   . OCD Maternal Grandmother   . ADD / ADHD Maternal Grandmother   . Stroke Paternal Grandmother   . Elevated Lipids Paternal Grandfather   . Diabetes Paternal Grandfather     Allergies  Allergen Reactions  . No Known Allergies  Current Outpatient Medications on File Prior to Visit  Medication Sig Dispense Refill  . FLUoxetine (PROZAC) 20 MG capsule fluoxetine 20 mg capsule  TAKE 1 CAPSULE BY MOUTH EVERY DAY    . omeprazole (PRILOSEC) 40 MG capsule Take 40 mg by mouth daily. Take 20 minutes before breakfast     No current facility-administered medications on file prior to visit.    BP 116/78   Temp 97.9 F (36.6 C)   Wt 199 lb (90.3 kg)   BMI 33.12 kg/m       Objective:   Physical Exam Vitals and nursing note reviewed.  Constitutional:      Appearance: Normal appearance. She is obese.  Cardiovascular:     Rate and Rhythm: Normal rate and regular rhythm.     Pulses: Normal pulses.     Heart sounds: Normal heart sounds.  Pulmonary:     Effort: Pulmonary effort is normal.     Breath sounds: Normal breath sounds.   Abdominal:     General: Abdomen is flat. Bowel sounds are normal. There is no distension.     Palpations: Abdomen is soft. There is no mass.     Tenderness: There is no abdominal tenderness.     Hernia: No hernia is present.     Comments: No hernia noted on exam, but body habitus could obscure small fat-containing hernia.  No pain with palpation.  Musculoskeletal:        General: Normal range of motion.  Skin:    General: Skin is warm and dry.  Neurological:     General: No focal deficit present.     Mental Status: She is alert and oriented to person, place, and time.  Psychiatric:        Mood and Affect: Mood normal.        Behavior: Behavior normal.        Thought Content: Thought content normal.        Judgment: Judgment normal.       Assessment & Plan:  1. Periumbilical abdominal pain -GERD versus hernia pain.  We discussed possible etiologies.  She may be experiencing pain from the hernia or referred pain from GERD.  We will have her trial taking Prilosec daily for 14 days to see if symptoms improve.  She is not interested in hernia surgery at this time, which I believe is okay since she is having no other symptoms.  She will follow-up as needed  Shirline Frees, NP

## 2022-04-17 IMAGING — CT CT ABD-PELV W/ CM
2 of 5 series · 11 of 46 positions shown, 12 images · IV contrast (iopamidol)
Comparison: None.

CLINICAL DATA: Left lower quadrant abdominal pain. Concern for
mass.

EXAM:
CT ABDOMEN AND PELVIS WITH CONTRAST
TECHNIQUE: Multidetector CT imaging of the abdomen and pelvis was performed
using the standard protocol following bolus administration of
intravenous contrast.
CONTRAST:  100mL [id number] IOPAMIDOL ([id number]-GSS) INJECTION 61%

[Series 2: abd pelvis 5.00 br40 s3 axial · axial · 0.62mm/px · z∈[+1101,+1476]mm · 8 of 93 slices shown, 9 images]
[im 9/93  soft-tissue]
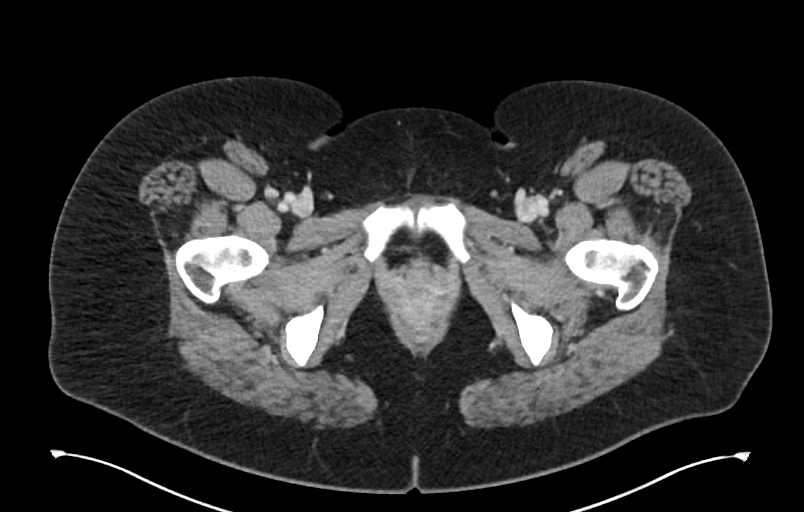
[im 9/93  bone]
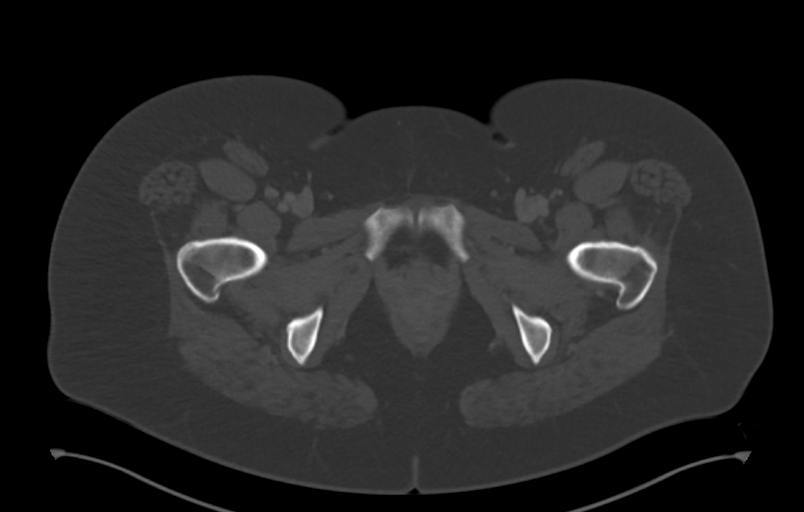
[im 17/93  soft-tissue]
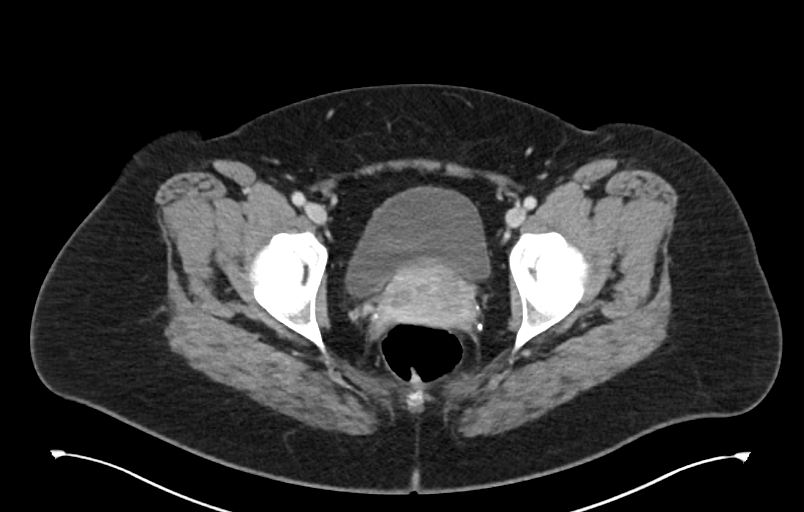
[im 30/93  soft-tissue]
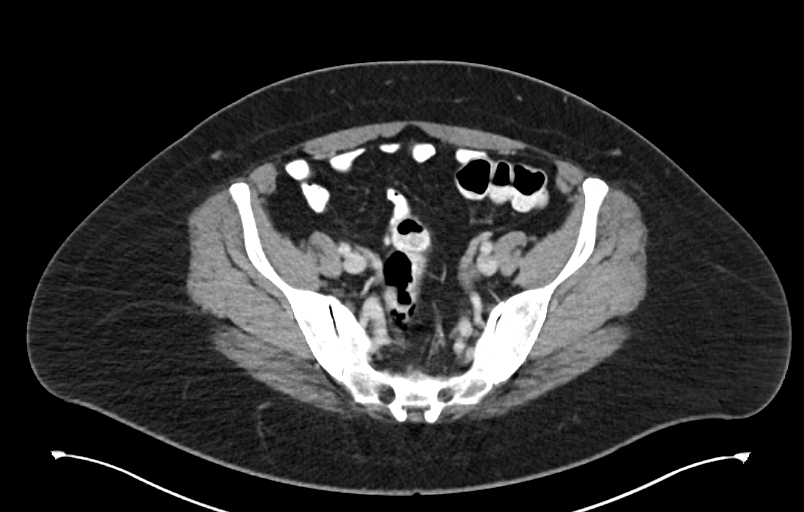
[im 42/93  soft-tissue]
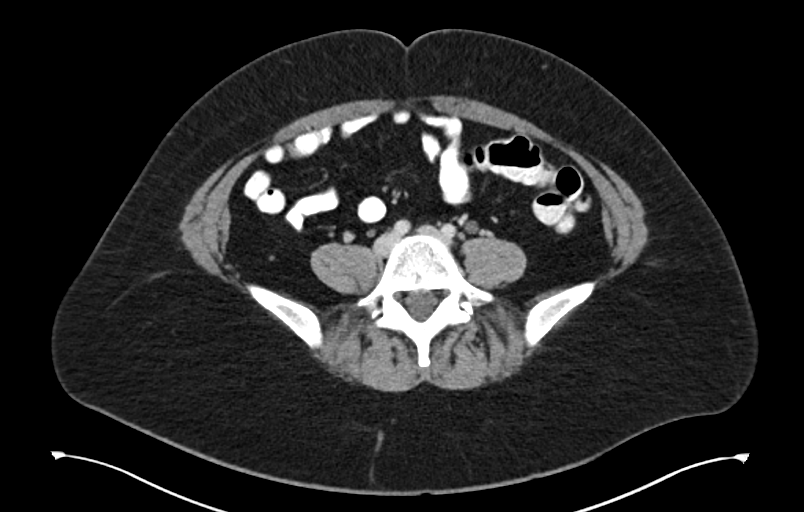
[im 51/93  soft-tissue]
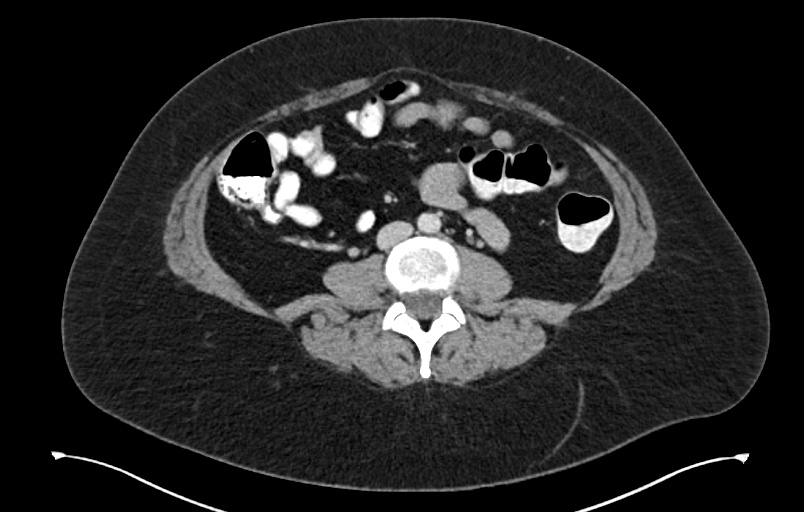
[im 63/93  soft-tissue]
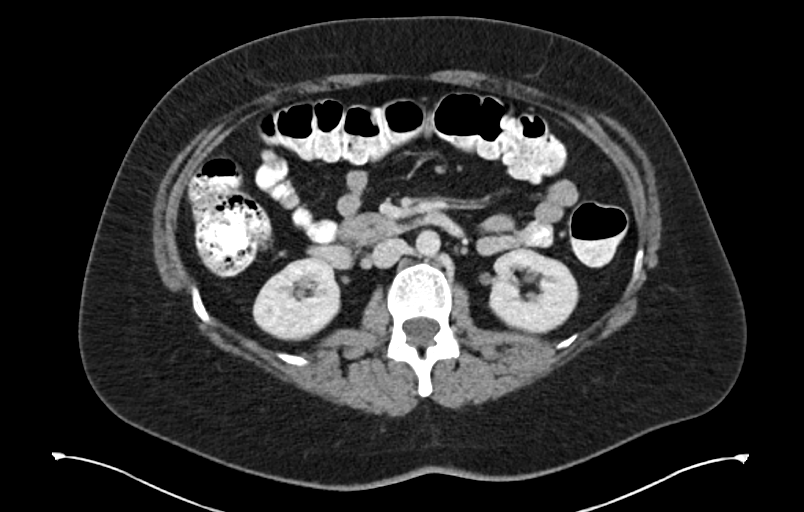
[im 76/93  soft-tissue]
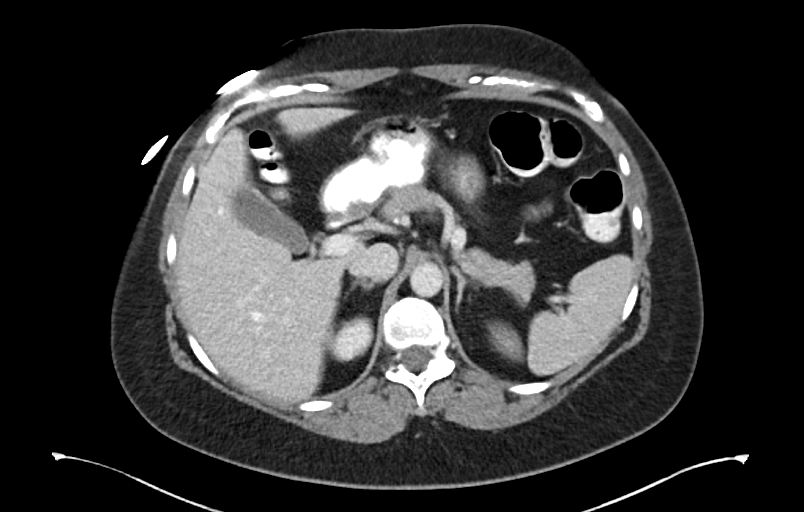
[im 84/93  soft-tissue]
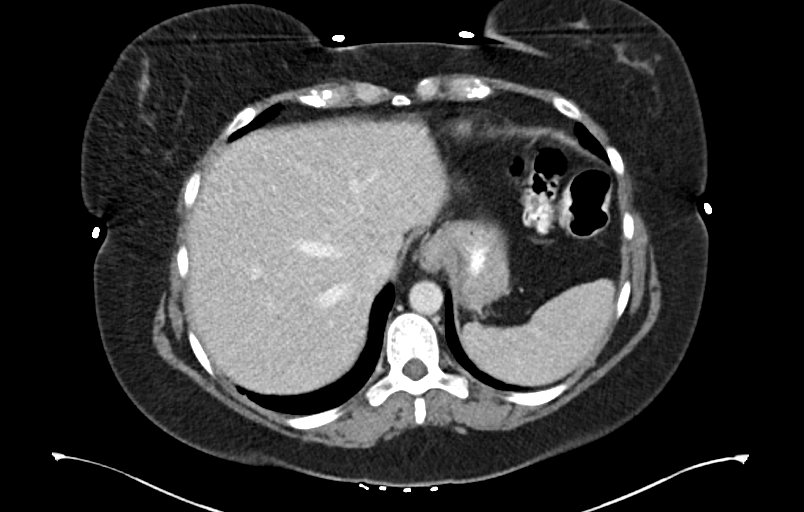

[Series 6: abd pelvis 2.00 br40 s3 cor · coronal · 0.91mm/px · 3 of 159 slices shown]
[im 53/159  soft-tissue]
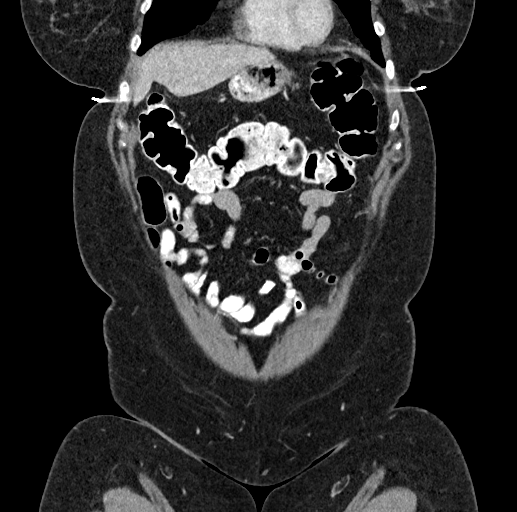
[im 71/159  soft-tissue]
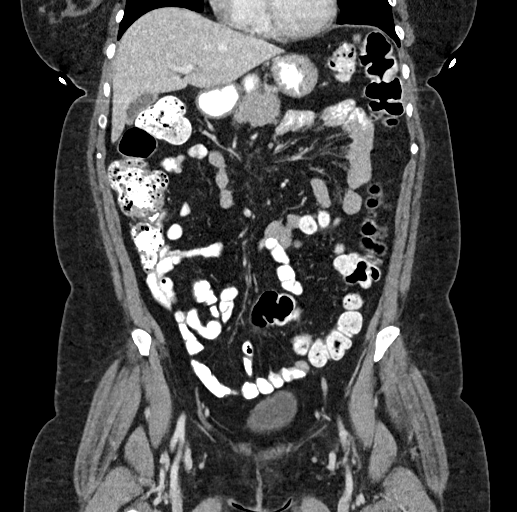
[im 88/159  soft-tissue]
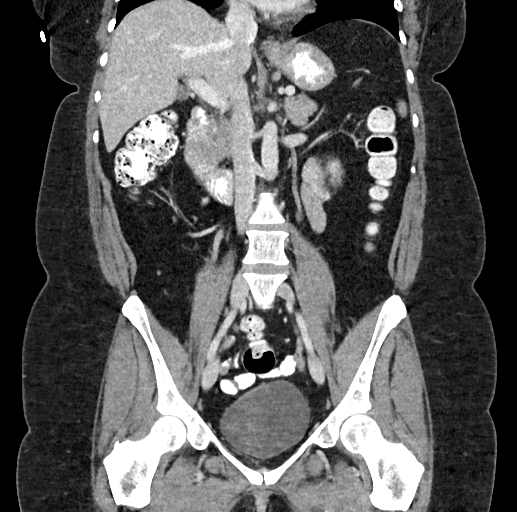

[11 of 46 positions shown; findings below may reference images not displayed]

FINDINGS: Lower chest: The lung bases are clear. The heart size is normal.

Hepatobiliary: The liver is normal. Normal gallbladder.There is no
biliary ductal dilation.

Pancreas: Normal contours without ductal dilatation. No
peripancreatic fluid collection.

Spleen: Unremarkable.

Adrenals/Urinary Tract:

--Adrenal glands: Unremarkable.

--Right kidney/ureter: No hydronephrosis or radiopaque kidney
stones.

--Left kidney/ureter: No hydronephrosis or radiopaque kidney stones.

--Urinary bladder: Unremarkable.

Stomach/Bowel:

--Stomach/Duodenum: No hiatal hernia or other gastric abnormality.
Normal duodenal course and caliber.

--Small bowel: Unremarkable.

--Colon: Unremarkable.

--Appendix: Normal.

Vascular/Lymphatic: Normal course and caliber of the major abdominal
vessels.

--No retroperitoneal lymphadenopathy.

--No mesenteric lymphadenopathy.

--No pelvic or inguinal lymphadenopathy.

Reproductive: Unremarkable

Other: No ascites or free air. There is a small fat containing
umbilical hernia.

Musculoskeletal. No acute displaced fractures.
IMPRESSION: No acute abdominopelvic abnormality. No findings to explain the
patient's left lower quadrant pain.

## 2023-10-24 ENCOUNTER — Encounter: Payer: Self-pay | Admitting: Adult Health

## 2023-10-24 ENCOUNTER — Ambulatory Visit (INDEPENDENT_AMBULATORY_CARE_PROVIDER_SITE_OTHER): Payer: Self-pay | Admitting: Adult Health

## 2023-10-24 VITALS — BP 110/80 | HR 87 | Temp 98.3°F | Ht 64.1 in | Wt 160.0 lb

## 2023-10-24 DIAGNOSIS — F419 Anxiety disorder, unspecified: Secondary | ICD-10-CM | POA: Diagnosis not present

## 2023-10-24 DIAGNOSIS — Z114 Encounter for screening for human immunodeficiency virus [HIV]: Secondary | ICD-10-CM | POA: Diagnosis not present

## 2023-10-24 DIAGNOSIS — F32A Depression, unspecified: Secondary | ICD-10-CM | POA: Diagnosis not present

## 2023-10-24 DIAGNOSIS — Z0000 Encounter for general adult medical examination without abnormal findings: Secondary | ICD-10-CM | POA: Diagnosis not present

## 2023-10-24 DIAGNOSIS — Z1211 Encounter for screening for malignant neoplasm of colon: Secondary | ICD-10-CM

## 2023-10-24 DIAGNOSIS — Z1159 Encounter for screening for other viral diseases: Secondary | ICD-10-CM | POA: Diagnosis not present

## 2023-10-24 LAB — CBC WITH DIFFERENTIAL/PLATELET
Basophils Absolute: 0.1 10*3/uL (ref 0.0–0.1)
Basophils Relative: 1 % (ref 0.0–3.0)
Eosinophils Absolute: 0.2 10*3/uL (ref 0.0–0.7)
Eosinophils Relative: 2.6 % (ref 0.0–5.0)
HCT: 41.4 % (ref 36.0–46.0)
Hemoglobin: 14.1 g/dL (ref 12.0–15.0)
Lymphocytes Relative: 26.2 % (ref 12.0–46.0)
Lymphs Abs: 1.7 10*3/uL (ref 0.7–4.0)
MCHC: 34.2 g/dL (ref 30.0–36.0)
MCV: 86.8 fL (ref 78.0–100.0)
Monocytes Absolute: 0.5 10*3/uL (ref 0.1–1.0)
Monocytes Relative: 7.1 % (ref 3.0–12.0)
Neutro Abs: 4 10*3/uL (ref 1.4–7.7)
Neutrophils Relative %: 63.1 % (ref 43.0–77.0)
Platelets: 358 10*3/uL (ref 150.0–400.0)
RBC: 4.76 Mil/uL (ref 3.87–5.11)
RDW: 14.4 % (ref 11.5–15.5)
WBC: 6.4 10*3/uL (ref 4.0–10.5)

## 2023-10-24 LAB — COMPREHENSIVE METABOLIC PANEL
ALT: 13 U/L (ref 0–35)
AST: 17 U/L (ref 0–37)
Albumin: 3 g/dL — ABNORMAL LOW (ref 3.5–5.2)
Alkaline Phosphatase: 71 U/L (ref 39–117)
BUN: 11 mg/dL (ref 6–23)
CO2: 26 meq/L (ref 19–32)
Calcium: 9.2 mg/dL (ref 8.4–10.5)
Chloride: 103 meq/L (ref 96–112)
Creatinine, Ser: 0.68 mg/dL (ref 0.40–1.20)
GFR: 102.78 mL/min (ref >60.00)
Glucose, Bld: 82 mg/dL (ref 70–99)
Potassium: 3.8 meq/L (ref 3.5–5.1)
Sodium: 137 meq/L (ref 135–145)
Total Bilirubin: 0.4 mg/dL (ref 0.2–1.2)
Total Protein: 6.9 g/dL (ref 6.0–8.3)

## 2023-10-24 LAB — LIPID PANEL
Cholesterol: 204 mg/dL — ABNORMAL HIGH (ref 0–200)
HDL: 48.8 mg/dL (ref >39.00)
LDL Cholesterol: 138 mg/dL — ABNORMAL HIGH (ref 0–99)
NonHDL: 154.82
Total CHOL/HDL Ratio: 4
Triglycerides: 84 mg/dL (ref 0.0–149.0)
VLDL: 16.8 mg/dL (ref 0.0–40.0)

## 2023-10-24 LAB — TSH: TSH: 1.73 u[IU]/mL (ref 0.35–5.50)

## 2023-10-24 NOTE — Patient Instructions (Signed)
 It was great seeing you today   We will follow up with you regarding your lab work   Please let me know if you need anything

## 2023-10-24 NOTE — Progress Notes (Signed)
 Subjective:    Patient ID: Victoria Tanner, female    DOB: May 14, 1976, 48 y.o.   MRN: [id number]  HPI Patient presents for yearly preventative medicine examination. He is a pleasant 48 year old female who  has a past medical history of Chicken pox, Depression, and Migraines.  Anxiety and Depression - continues to take Prozac 20 mg. Feels well controlled. This is prescribed by GYN    All immunizations and health maintenance protocols were reviewed with the patient and needed orders were placed.  Appropriate screening laboratory values were ordered for the patient including screening of hyperlipidemia, renal function and hepatic function.  Medication reconciliation,  past medical history, social history, problem list and allergies were reviewed in detail with the patient  Goals were established with regard to weight loss, exercise, and  diet in compliance with medications. She is not exercising much but tries to eat healthy.  She would like to do cologuard for colon cancer screening    Review of Systems  Constitutional: Negative.   HENT: Negative.    Eyes: Negative.   Respiratory: Negative.    Cardiovascular: Negative.   Gastrointestinal: Negative.   Endocrine: Negative.   Genitourinary: Negative.   Musculoskeletal: Negative.   Skin: Negative.   Allergic/Immunologic: Negative.   Neurological: Negative.   Hematological: Negative.   Psychiatric/Behavioral: Negative.     Past Medical History:  Diagnosis Date   Chicken pox    Depression    Migraines    once every few months    Social History   Socioeconomic History   Marital status: Married    Spouse name: Not on file   Number of children: Not on file   Years of education: Not on file   Highest education level: Not on file  Occupational History   Not on file  Tobacco Use   Smoking status: Never   Smokeless tobacco: Never  Substance and Sexual Activity   Alcohol use: No    Alcohol/week: 0.0 standard drinks of  alcohol   Drug use: No   Sexual activity: Not on file  Other Topics Concern   Not on file  Social History Narrative   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    Married for 13 years   Two children    She likes to do photography and paint.    Social Drivers of Corporate investment banker Strain: Not on file  Food Insecurity: Not on file  Transportation Needs: Not on file  Physical Activity: Not on file  Stress: Not on file  Social Connections: Not on file  Intimate Partner Violence: Not on file    Past Surgical History:  Procedure Laterality Date   SALPINGECTOMY Right 2008   partial, from ectopic    Family History  Problem Relation Age of Onset   Hypertension Mother    Depression Mother    OCD Mother    ADD / ADHD Mother    Elevated Lipids Father    Arthritis Maternal Grandmother    Elevated Lipids Maternal Grandmother    Hypertension Maternal Grandmother    Stroke Maternal Grandmother    Depression Maternal Grandmother    OCD Maternal Grandmother    ADD / ADHD Maternal Grandmother    Stroke Paternal Grandmother    Elevated Lipids Paternal Grandfather    Diabetes Paternal Grandfather     Allergies  Allergen Reactions   No Known Allergies     Current Outpatient Medications on  File Prior to Visit  Medication Sig Dispense Refill   cholecalciferol (VITAMIN D3) 25 MCG (1000 UNIT) tablet Take 1,000 Units by mouth daily.     FLUoxetine (PROZAC) 20 MG capsule fluoxetine 20 mg capsule  TAKE 1 CAPSULE BY MOUTH EVERY DAY     Multiple Vitamin (MULTIVITAMIN) capsule Take 1 capsule by mouth daily.     No current facility-administered medications on file prior to visit.    BP 110/80   Pulse 87   Temp 98.3 F (36.8 C) (Oral)   Ht 5' 4.1" (1.628 m)   Wt 160 lb (72.6 kg)   SpO2 99%   BMI 27.38 kg/m       Objective:   Physical Exam Vitals and nursing note reviewed.  Constitutional:      General: She is not in acute distress.     Appearance: Normal appearance. She is not ill-appearing.  HENT:     Head: Normocephalic and atraumatic.     Right Ear: Tympanic membrane, ear canal and external ear normal. There is no impacted cerumen.     Left Ear: Tympanic membrane, ear canal and external ear normal. There is no impacted cerumen.     Nose: Nose normal. No congestion or rhinorrhea.     Mouth/Throat:     Mouth: Mucous membranes are moist.     Pharynx: Oropharynx is clear.  Eyes:     Extraocular Movements: Extraocular movements intact.     Conjunctiva/sclera: Conjunctivae normal.     Pupils: Pupils are equal, round, and reactive to light.  Neck:     Vascular: No carotid bruit.  Cardiovascular:     Rate and Rhythm: Normal rate and regular rhythm.     Pulses: Normal pulses.     Heart sounds: No murmur heard.    No friction rub. No gallop.  Pulmonary:     Effort: Pulmonary effort is normal.     Breath sounds: Normal breath sounds.  Abdominal:     General: Abdomen is flat. Bowel sounds are normal. There is no distension.     Palpations: Abdomen is soft. There is no mass.     Tenderness: There is no abdominal tenderness. There is no guarding or rebound.     Hernia: No hernia is present.  Musculoskeletal:        General: Normal range of motion.     Cervical back: Normal range of motion and neck supple.  Lymphadenopathy:     Cervical: No cervical adenopathy.  Skin:    General: Skin is warm and dry.     Capillary Refill: Capillary refill takes less than 2 seconds.  Neurological:     General: No focal deficit present.     Mental Status: She is alert and oriented to person, place, and time.  Psychiatric:        Mood and Affect: Mood normal.        Behavior: Behavior normal.        Thought Content: Thought content normal.        Judgment: Judgment normal.        Assessment & Plan:  1. Routine general medical examination at a health care facility (Primary) Today patient counseled on age appropriate routine  health concerns for screening and prevention, each reviewed and up to date or declined. Immunizations reviewed and up to date or declined. Labs ordered and reviewed. Risk factors for depression reviewed and negative. Hearing function and visual acuity are intact. ADLs screened and addressed as needed. Functional  ability and level of safety reviewed and appropriate. Education, counseling and referrals performed based on assessed risks today. Patient provided with a copy of personalized plan for preventive services. - Follow up in one year or sooner if needed - Exercise   2. Screening for colon cancer  - Cologuard; Future  3. Anxiety and depression  - CBC with Differential/Platelet; Future - Comprehensive metabolic panel; Future - Lipid panel; Future - TSH; Future  4. Need for hepatitis C screening test  - Hep C Antibody; Future  5. Encounter for screening for HIV  - HIV Antibody (routine testing w rflx); Future  Shirline Frees, NP

## 2023-10-25 LAB — HIV ANTIBODY (ROUTINE TESTING W REFLEX): HIV 1&2 Ab, 4th Generation: NONREACTIVE

## 2023-10-25 LAB — HEPATITIS C ANTIBODY: Hepatitis C Ab: NONREACTIVE
